# Patient Record
Sex: Female | Born: 1980
Health system: Southern US, Community
[De-identification: ages and names within clinical notes are randomized; demographics above are authoritative.]

## PROBLEM LIST (undated history)

## (undated) DIAGNOSIS — Z8619 Personal history of other infectious and parasitic diseases: Secondary | ICD-10-CM

## (undated) HISTORY — DX: Personal history of other infectious and parasitic diseases: Z86.19

---
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
## 2006-02-19 ENCOUNTER — Emergency Department (HOSPITAL_COMMUNITY): Admission: EM | Admit: 2006-02-19 | Discharge: 2006-02-19 | Payer: Self-pay | Admitting: Emergency Medicine

## 2011-04-03 LAB — OB RESULTS CONSOLE GC/CHLAMYDIA
Chlamydia: NEGATIVE
Gonorrhea: NEGATIVE

## 2011-04-04 LAB — OB RESULTS CONSOLE ANTIBODY SCREEN: Antibody Screen: NEGATIVE

## 2011-04-04 LAB — CBC
HCT: 36 % (ref 36–46)
Hemoglobin: 12.6 g/dL (ref 12.0–16.0)
Hemoglobin: 12.6 g/dL (ref 12.0–16.0)
Platelets: 241 10*3/uL (ref 150–399)

## 2011-04-04 LAB — OB RESULTS CONSOLE RPR: RPR: NONREACTIVE

## 2011-04-04 LAB — OB RESULTS CONSOLE RUBELLA ANTIBODY, IGM: Rubella: IMMUNE

## 2011-04-04 LAB — OB RESULTS CONSOLE ABO/RH: RH Type: POSITIVE

## 2011-04-04 LAB — OB RESULTS CONSOLE HIV ANTIBODY (ROUTINE TESTING): HIV: NONREACTIVE

## 2011-04-04 LAB — OB RESULTS CONSOLE HEPATITIS B SURFACE ANTIGEN: Hepatitis B Surface Ag: NEGATIVE

## 2011-07-12 LAB — CBC: Hemoglobin: 12.3 g/dL (ref 12.0–16.0)

## 2011-07-25 ENCOUNTER — Encounter (INDEPENDENT_AMBULATORY_CARE_PROVIDER_SITE_OTHER): Payer: 59 | Admitting: Obstetrics and Gynecology

## 2011-07-25 DIAGNOSIS — Z331 Pregnant state, incidental: Secondary | ICD-10-CM

## 2011-08-07 ENCOUNTER — Encounter: Payer: 59 | Admitting: Obstetrics and Gynecology

## 2011-08-13 ENCOUNTER — Encounter: Payer: 59 | Admitting: Obstetrics and Gynecology

## 2011-08-29 ENCOUNTER — Ambulatory Visit (INDEPENDENT_AMBULATORY_CARE_PROVIDER_SITE_OTHER): Payer: 59 | Admitting: Obstetrics and Gynecology

## 2011-08-29 ENCOUNTER — Encounter: Payer: Self-pay | Admitting: Obstetrics and Gynecology

## 2011-08-29 VITALS — BP 102/68 | Ht 61.0 in | Wt 150.0 lb

## 2011-08-29 DIAGNOSIS — Z331 Pregnant state, incidental: Secondary | ICD-10-CM

## 2011-08-29 NOTE — Progress Notes (Signed)
Pt without complaints A/P GBS @[facility]  Fetal kick counts reviewed Labor reviewed with pt All patients  questions answered

## 2011-09-13 ENCOUNTER — Encounter: Payer: Self-pay | Admitting: Obstetrics and Gynecology

## 2011-09-13 ENCOUNTER — Ambulatory Visit (INDEPENDENT_AMBULATORY_CARE_PROVIDER_SITE_OTHER): Payer: 59 | Admitting: Obstetrics and Gynecology

## 2011-09-13 VITALS — BP 110/70 | Wt 155.0 lb

## 2011-09-13 DIAGNOSIS — Z331 Pregnant state, incidental: Secondary | ICD-10-CM

## 2011-09-13 NOTE — Patient Instructions (Signed)
Patient Education Materials to be provided at check out (*indicates is located in accordion folder):  Newborn Circumcision  

## 2011-09-13 NOTE — Progress Notes (Signed)
No complaints Circumcision reviewed GBS done today

## 2011-09-17 LAB — OB RESULTS CONSOLE GBS: GBS: NEGATIVE

## 2011-09-20 ENCOUNTER — Ambulatory Visit (INDEPENDENT_AMBULATORY_CARE_PROVIDER_SITE_OTHER): Payer: 59 | Admitting: Obstetrics and Gynecology

## 2011-09-20 VITALS — BP 132/80 | Wt 158.0 lb

## 2011-09-20 DIAGNOSIS — Z3400 Encounter for supervision of normal first pregnancy, unspecified trimester: Secondary | ICD-10-CM | POA: Insufficient documentation

## 2011-09-20 NOTE — Progress Notes (Signed)
Pt. Stated no issues today. Pt stated she wants cervix check today. No c/o.  Will work until delivery.   Undecided re peds.

## 2011-09-26 ENCOUNTER — Ambulatory Visit (INDEPENDENT_AMBULATORY_CARE_PROVIDER_SITE_OTHER): Payer: 59 | Admitting: Obstetrics and Gynecology

## 2011-09-26 VITALS — BP 110/72 | Wt 160.0 lb

## 2011-09-26 DIAGNOSIS — Z331 Pregnant state, incidental: Secondary | ICD-10-CM

## 2011-09-26 NOTE — Progress Notes (Signed)
RTO 1 week.  AVS

## 2011-09-26 NOTE — Progress Notes (Signed)
Pt want cervix check today.   No other problems  lm

## 2011-10-04 ENCOUNTER — Encounter: Payer: Self-pay | Admitting: Obstetrics and Gynecology

## 2011-10-04 ENCOUNTER — Ambulatory Visit (INDEPENDENT_AMBULATORY_CARE_PROVIDER_SITE_OTHER): Payer: 59 | Admitting: Obstetrics and Gynecology

## 2011-10-04 VITALS — BP 112/62 | Wt 161.0 lb

## 2011-10-04 DIAGNOSIS — Z3400 Encounter for supervision of normal first pregnancy, unspecified trimester: Secondary | ICD-10-CM

## 2011-10-04 NOTE — Progress Notes (Signed)
Complaint of pressure Doing well Fetal kick counts and labor precautions Return to office in one week If patient is still pregnant at next visit, will look for induction dates at 41 weeks

## 2011-10-10 ENCOUNTER — Encounter: Payer: Self-pay | Admitting: Obstetrics and Gynecology

## 2011-10-10 ENCOUNTER — Ambulatory Visit (INDEPENDENT_AMBULATORY_CARE_PROVIDER_SITE_OTHER): Payer: 59 | Admitting: Obstetrics and Gynecology

## 2011-10-10 VITALS — BP 116/78 | Ht 61.0 in | Wt 162.0 lb

## 2011-10-10 DIAGNOSIS — O2686 Pruritic urticarial papules and plaques of pregnancy (PUPPP): Secondary | ICD-10-CM

## 2011-10-10 DIAGNOSIS — O480 Post-term pregnancy: Secondary | ICD-10-CM

## 2011-10-10 DIAGNOSIS — L988 Other specified disorders of the skin and subcutaneous tissue: Secondary | ICD-10-CM

## 2011-10-10 DIAGNOSIS — Z3400 Encounter for supervision of normal first pregnancy, unspecified trimester: Secondary | ICD-10-CM

## 2011-10-10 DIAGNOSIS — O26899 Other specified pregnancy related conditions, unspecified trimester: Secondary | ICD-10-CM

## 2011-10-10 DIAGNOSIS — R21 Rash and other nonspecific skin eruption: Secondary | ICD-10-CM

## 2011-10-10 MED ORDER — CLOBETASOL PROP EMOLLIENT BASE 0.05 % EX CREA: TOPICAL_CREAM | CUTANEOUS | Status: DC

## 2011-10-10 NOTE — Progress Notes (Signed)
1 wk hx of pruritic red rash over abdomen=PUPP Temovate cream given and educational info NST [facility] Induction at 41-42 wks if undelivered Membranes stripped

## 2011-10-10 NOTE — Progress Notes (Signed)
Desires cx check today.

## 2011-10-10 NOTE — Patient Instructions (Signed)
Pruritic Urticarial Papules and Plaques of Pregnancy  When you are pregnant, your body changes in many ways. That includes the skin. Rashes sometimes develop. The most common skin rash during pregnancy is called pruritic urticarial papules and plaques of pregnancy (PUPPP). The small red bumps sometimes form large plaques. These are very itchy. The rash usually appears in the last few weeks of pregnancy during the third trimester. Sometimes, it can occur shortly after giving birth. It goes away within 1 to 2 months after delivery. It is not harmful. You may not like the way it looks or feels. However, there is no reason to believe that it does any harm to your baby. It will not leave scars on your skin.  CAUSES   Exactly why this skin rash develops is not known.   Your skin stretches rapidly when you are pregnant. This may cause PUPPP. Stretching happens most over the belly. Skin also stretches in other areas, including the legs, hips, and breasts.   Some women are more likely than others to develop PUPPP. They include:   Those who are having a baby for the first time.   Women who gain too much weight.   Those who are carrying more than 1 baby.  SYMPTOMS    Appearance of a red, raised rash.   It might look like hives or an allergic reaction.   Sometimes, there are tiny blisters in the center of the patches.   The skin around the rash is often pale.   The rash is usually on the belly. It can spread to other parts of the body, such as the thighs or arms.   Severe itching.  DIAGNOSIS   To decide if you have PUPPP, your caregiver may:   Ask about symptoms you have noticed.   Ask about your overall health, today and in the past.   Ask about your pregnancy.   Ask whether you have been near anything that could cause an allergic reaction.   Examine the skin on parts of the body that concern you.   Order a biopsy. A small sample of skin tissue is removed and looked at under a microscope.   Order blood tests.  These may rule out other reasons for a rash.  TREATMENT   The goal is to stop the itching and keep the rash from spreading. Usually, a cream is used to do this. However, treatment varies. Common options include:   Topical corticosteroids. These are powerful drugs that can help stop itching.   Usually, the drug is used as a cream or ointment. It is put directly on the rash.   Sometimes, a stronger oral corticosteroid is needed. This is taken as a pill.   Antihistamines. These drugs are sometimes given for PUPPP. They are oral medicines. They are normally used for allergies, but they may help with the intense itching that can be seen in PUPPP.  Treatment helps nearly all women with PUPPP. The creams or pills should make your skin feel better fairly quickly. The rash and itching should be gone a few months after your baby is born.  HOME CARE INSTRUCTIONS    Do not scratch the rash.   Wear loose clothing.   Apply any creams that your caregiver prescribed. Follow the directions carefully.   Keep all follow-up appointments with your caregiver. This will allow your caregiver to make sure your treatment is working.   Learn more about skin care. You might want to meet with a skin   doctor (dermatologist). Ask about a moisturizer that would help your skin while it stretches during pregnancy. Also, ask about choosing skin products that are right for you.  SEEK MEDICAL CARE IF:    You have any questions about your medicines.   The itching continues or the rash continues to spread.   You continue to be uncomfortable or unable to sleep. Your caregiver may want to change your medicine.  Document Released: 07/31/2009 Document Revised: 04/25/2011 Document Reviewed: 07/31/2009  [employer] Patient Information 2012 [employer].

## 2011-10-13 ENCOUNTER — Encounter (HOSPITAL_COMMUNITY): Payer: Self-pay | Admitting: *Deleted

## 2011-10-13 ENCOUNTER — Inpatient Hospital Stay (HOSPITAL_COMMUNITY)
Admission: AD | Admit: 2011-10-13 | Discharge: 2011-10-17 | DRG: 765 | Disposition: A | Discharge status: Home / Self Care | Payer: 59 | Source: Ambulatory Visit | Attending: Obstetrics and Gynecology | Admitting: Obstetrics and Gynecology

## 2011-10-13 DIAGNOSIS — O9903 Anemia complicating the puerperium: Secondary | ICD-10-CM | POA: Diagnosis not present

## 2011-10-13 DIAGNOSIS — O2686 Pruritic urticarial papules and plaques of pregnancy (PUPPP): Secondary | ICD-10-CM

## 2011-10-13 DIAGNOSIS — Z98891 History of uterine scar from previous surgery: Secondary | ICD-10-CM

## 2011-10-13 DIAGNOSIS — O139 Gestational [pregnancy-induced] hypertension without significant proteinuria, unspecified trimester: Secondary | ICD-10-CM | POA: Diagnosis present

## 2011-10-13 DIAGNOSIS — Z3400 Encounter for supervision of normal first pregnancy, unspecified trimester: Secondary | ICD-10-CM

## 2011-10-13 DIAGNOSIS — D689 Coagulation defect, unspecified: Secondary | ICD-10-CM | POA: Diagnosis present

## 2011-10-13 DIAGNOSIS — O1490 Unspecified pre-eclampsia, unspecified trimester: Secondary | ICD-10-CM | POA: Diagnosis present

## 2011-10-13 DIAGNOSIS — D696 Thrombocytopenia, unspecified: Secondary | ICD-10-CM | POA: Diagnosis present

## 2011-10-13 DIAGNOSIS — D649 Anemia, unspecified: Secondary | ICD-10-CM | POA: Diagnosis not present

## 2011-10-13 DIAGNOSIS — O9912 Other diseases of the blood and blood-forming organs and certain disorders involving the immune mechanism complicating childbirth: Secondary | ICD-10-CM | POA: Diagnosis present

## 2011-10-13 DIAGNOSIS — R21 Rash and other nonspecific skin eruption: Secondary | ICD-10-CM

## 2011-10-13 LAB — URINALYSIS, ROUTINE W REFLEX MICROSCOPIC
Bilirubin Urine: NEGATIVE
Glucose, UA: NEGATIVE mg/dL
Ketones, ur: NEGATIVE mg/dL
Nitrite: NEGATIVE
Protein, ur: NEGATIVE mg/dL
Urobilinogen, UA: 0.2 mg/dL (ref 0.0–1.0)
pH: 6 (ref 5.0–8.0)

## 2011-10-13 LAB — COMPREHENSIVE METABOLIC PANEL
Albumin: 2.6 g/dL — ABNORMAL LOW (ref 3.5–5.2)
Alkaline Phosphatase: 194 U/L — ABNORMAL HIGH (ref 39–117)
BUN: 16 mg/dL (ref 6–23)
Chloride: 102 mEq/L (ref 96–112)
Creatinine, Ser: 1.16 mg/dL — ABNORMAL HIGH (ref 0.50–1.10)
GFR calc Af Amer: 74 mL/min — ABNORMAL LOW (ref >90)
Glucose, Bld: 77 mg/dL (ref 70–99)
Total Bilirubin: 0.4 mg/dL (ref 0.3–1.2)

## 2011-10-13 LAB — CBC
MCV: 95.3 fL (ref 78.0–100.0)
Platelets: 126 10*3/uL — ABNORMAL LOW (ref 150–400)
RBC: 3.8 MIL/uL — ABNORMAL LOW (ref 3.87–5.11)
RDW: 13.8 % (ref 11.5–15.5)
WBC: 6.1 10*3/uL (ref 4.0–10.5)

## 2011-10-13 LAB — LACTATE DEHYDROGENASE: LDH: 374 U/L — ABNORMAL HIGH (ref 94–250)

## 2011-10-13 LAB — URIC ACID: Uric Acid, Serum: 6.9 mg/dL (ref 2.4–7.0)

## 2011-10-13 LAB — URINE MICROSCOPIC-ADD ON

## 2011-10-13 MED ORDER — CITRIC ACID-SODIUM CITRATE 334-500 MG/5ML PO SOLN
30.0000 mL | ORAL | Status: DC | PRN
  Administered 2011-10-14: 30 mL via ORAL
  Filled 2011-10-13: qty 15

## 2011-10-13 MED ORDER — HYDROXYZINE HCL 50 MG PO TABS
50.0000 mg | ORAL_TABLET | Freq: Four times a day (QID) | ORAL | Status: DC | PRN
  Administered 2011-10-13: 50 mg via ORAL
  Filled 2011-10-13: qty 1

## 2011-10-13 MED ORDER — IBUPROFEN 600 MG PO TABS: 600.0000 mg | ORAL_TABLET | Freq: Four times a day (QID) | ORAL | Status: DC | PRN

## 2011-10-13 MED ORDER — ACETAMINOPHEN 325 MG PO TABS
650.0000 mg | ORAL_TABLET | ORAL | Status: DC | PRN
  Administered 2011-10-14: 650 mg via ORAL
  Filled 2011-10-13: qty 2

## 2011-10-13 MED ORDER — MISOPROSTOL 25 MCG QUARTER TABLET: 25.0000 ug | ORAL_TABLET | ORAL | Status: DC | PRN

## 2011-10-13 MED ORDER — TERBUTALINE SULFATE 1 MG/ML IJ SOLN: 0.2500 mg | Freq: Once | INTRAMUSCULAR | Status: AC | PRN

## 2011-10-13 MED ORDER — OXYTOCIN 20 UNITS IN LACTATED RINGERS INFUSION - SIMPLE
1.0000 m[IU]/min | INTRAVENOUS | Status: DC
  Administered 2011-10-13: 1 m[IU]/min via INTRAVENOUS

## 2011-10-13 MED ORDER — HYDROXYZINE HCL 50 MG/ML IM SOLN
50.0000 mg | Freq: Four times a day (QID) | INTRAMUSCULAR | Status: DC | PRN
  Filled 2011-10-13: qty 1

## 2011-10-13 MED ORDER — OXYTOCIN 20 UNITS IN LACTATED RINGERS INFUSION - SIMPLE: 125.0000 mL/h | Freq: Once | INTRAVENOUS | Status: DC

## 2011-10-13 MED ORDER — FLEET ENEMA 7-19 GM/118ML RE ENEM: 1.0000 | ENEMA | RECTAL | Status: DC | PRN

## 2011-10-13 MED ORDER — LIDOCAINE HCL (PF) 1 % IJ SOLN: 30.0000 mL | INTRAMUSCULAR | Status: DC | PRN

## 2011-10-13 MED ORDER — FENTANYL CITRATE 0.05 MG/ML IJ SOLN
50.0000 ug | INTRAMUSCULAR | Status: DC | PRN
  Administered 2011-10-13: 50 ug via INTRAVENOUS
  Filled 2011-10-13: qty 2

## 2011-10-13 MED ORDER — LIDOCAINE HCL 2 % EX GEL
Freq: Once | CUTANEOUS | Status: AC
  Administered 2011-10-13: 10 via URETHRAL
  Filled 2011-10-13: qty 5

## 2011-10-13 MED ORDER — ZOLPIDEM TARTRATE 10 MG PO TABS: 10.0000 mg | ORAL_TABLET | Freq: Every evening | ORAL | Status: DC | PRN

## 2011-10-13 MED ORDER — OXYTOCIN 20 UNITS IN LACTATED RINGERS INFUSION - SIMPLE
1.0000 m[IU]/min | INTRAVENOUS | Status: DC
  Filled 2011-10-13: qty 1000

## 2011-10-13 MED ORDER — OXYTOCIN BOLUS FROM INFUSION
500.0000 mL | Freq: Once | INTRAVENOUS | Status: DC
  Filled 2011-10-13: qty 500

## 2011-10-13 MED ORDER — LACTATED RINGERS IV SOLN
500.0000 mL | INTRAVENOUS | Status: DC | PRN
  Administered 2011-10-14: 500 mL via INTRAVENOUS

## 2011-10-13 MED ORDER — ONDANSETRON HCL 4 MG/2ML IJ SOLN: 4.0000 mg | Freq: Four times a day (QID) | INTRAMUSCULAR | Status: DC | PRN

## 2011-10-13 MED ORDER — OXYCODONE-ACETAMINOPHEN 5-325 MG PO TABS: 1.0000 | ORAL_TABLET | ORAL | Status: DC | PRN

## 2011-10-13 MED ORDER — LACTATED RINGERS IV SOLN
INTRAVENOUS | Status: DC
  Administered 2011-10-13 – 2011-10-14 (×4): via INTRAVENOUS

## 2011-10-13 NOTE — H&P (Signed)
Kathy Jenkins is a 31 y.o.black female presenting at 40.4 weeks for r/o ROM.  She presents unannounced and after arriving, noted pt's BP elevated and she c/o Rt-sided headache today.  Denied any gushes of fluid; reports some trickling of bloody d/c.  GFM.  No other PIH s/s.  Denies any recent illness or fever.  No resp or GI c/o's.  Hasn't eaten since breakfast.    Prenatal Course:  Pt has had an uncomplicated pregnancy.  She started care around 12 weeks, and reported an unsure LMP, and dates were confirmed by u/s at time of 1st trimester screen.  Both 1st trimester and AFP screens were negative, and had normal anatomy u/s w/ exception of isolated RVEIF.  At f/u u/s, foci no longer seen.  She had normal pregnancy discomforts.  At 39 weeks she started noting rash on abdomen and upper thighs, and Dr. Pennie Rushing dx'd w/ PUPP and rx'd topical steroid cream w/ some benefit since starting.  She has been normotensive at all exams.  She has gained a little over 40lbs total during pregnancy.  She hasn't had any 3rd trimester u/s.    Maternal Medical History:  Contractions: Frequency: rare.    Fetal activity: Perceived fetal activity is normal.    Prenatal complications: 1.  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  Unsure LMP 4.  RVEIF on anatomy, and not present on f/u u/s    OB History    Grav Para Term Preterm Abortions TAB SAB Ect Mult Living   1 0 0 0 0   0 0 0     Past Medical History  Diagnosis Date  . H/O varicella   . H/O candidiasis    History reviewed. No pertinent past surgical history. Family History: family history includes Asthma in her brother; Cancer in her mother; Diabetes in her maternal grandmother; Heart disease in her maternal aunt and maternal uncle; and Hypertension in her father, mother, and paternal grandmother. Social History:  reports that she has never smoked. She has never used smokeless tobacco. She reports that she does not drink alcohol or use illicit drugs.pt is Charity fundraiser.  Husband  "dennis" involved and supportive; he is originally from [place].  They are of Christian faith.    Review of Systems  Constitutional: Negative.   HENT:       HA today; hasn't tried any relief measures; points to Rt side of head above hairline; describes as feeling like headache when she is getting ready to start her period  Eyes: Negative.   Respiratory: Negative.   Cardiovascular: Negative.   Gastrointestinal: Negative.   Genitourinary: Negative.   Musculoskeletal: Negative.   Skin: Positive for rash.       Using hydrocortisone cream for rash w/ some relief, but not resolved  Neurological: Positive for headaches.   INITIAL BP'S IN MAU=159/95, 143/100, & 147/102 Dilation: 1.5 Effacement (%): 70 Station: -2 Exam by:: Hiliary Zygmunt Mcglinn CNM  Blood pressure 127/83, pulse 74, temperature 98.4 F (36.9 C), temperature source Oral, resp. rate 18, height 5\' 1"  (1.549 m), weight 163 lb (73.936 kg), last menstrual period 01/02/2011. Maternal Exam:  Uterine Assessment: Contraction strength is mild.  Contraction frequency is irregular.   Abdomen: Patient reports no abdominal tenderness. Fetal presentation: vertex  Introitus: Normal vulva. Ferning test: not done.  Nitrazine test: not done.  Pelvis: adequate for delivery.   Cervix: Cervix evaluated by digital exam.     Fetal Exam Fetal Monitor Review: Mode: ultrasound.   Baseline  rate: 140.  Variability: moderate (6-25 bpm).   Pattern: accelerations present and variable decelerations.    Fetal State Assessment: Category I - tracings are normal.    .. Results for orders placed during the hospital encounter of 10/13/11 (from the past 24 hour(s))  URINALYSIS, ROUTINE W REFLEX MICROSCOPIC     Status: Abnormal   Collection Time   10/13/11  3:30 PM      Component Value Range   Color, Urine YELLOW  YELLOW    APPearance CLEAR  CLEAR    Specific Gravity, Urine <1.005 (*) 1.005 - 1.030    pH 6.0  5.0 - 8.0    Glucose, UA NEGATIVE   NEGATIVE (mg/dL)   Hgb urine dipstick SMALL (*) NEGATIVE    Bilirubin Urine NEGATIVE  NEGATIVE    Ketones, ur NEGATIVE  NEGATIVE (mg/dL)   Protein, ur NEGATIVE  NEGATIVE (mg/dL)   Urobilinogen, UA 0.2  0.0 - 1.0 (mg/dL)   Nitrite NEGATIVE  NEGATIVE    Leukocytes, UA TRACE (*) NEGATIVE   URINE MICROSCOPIC-ADD ON     Status: Abnormal   Collection Time   10/13/11  3:30 PM      Component Value Range   Squamous Epithelial / LPF FEW (*) RARE    WBC, UA 0-2  <3 (WBC/hpf)   Bacteria, UA RARE  RARE   CBC     Status: Abnormal   Collection Time   10/13/11  4:01 PM      Component Value Range   WBC 6.1  4.0 - 10.5 (K/uL)   RBC 3.80 (*) 3.87 - 5.11 (MIL/uL)   Hemoglobin 12.4  12.0 - 15.0 (g/dL)   HCT 24.4  01.0 - 27.2 (%)   MCV 95.3  78.0 - 100.0 (fL)   MCH 32.6  26.0 - 34.0 (pg)   MCHC 34.3  30.0 - 36.0 (g/dL)   RDW 53.6  64.4 - 03.4 (%)   Platelets 126 (*) 150 - 400 (K/uL)  COMPREHENSIVE METABOLIC PANEL     Status: Abnormal   Collection Time   10/13/11  4:01 PM      Component Value Range   Sodium 132 (*) 135 - 145 (mEq/L)   Potassium 4.1  3.5 - 5.1 (mEq/L)   Chloride 102  96 - 112 (mEq/L)   CO2 19  19 - 32 (mEq/L)   Glucose, Bld 77  70 - 99 (mg/dL)   BUN 16  6 - 23 (mg/dL)   Creatinine, Ser 7.42 (*) 0.50 - 1.10 (mg/dL)   Calcium 8.9  8.4 - 59.5 (mg/dL)   Total Protein 6.0  6.0 - 8.3 (g/dL)   Albumin 2.6 (*) 3.5 - 5.2 (g/dL)   AST 29  0 - 37 (U/L)   ALT 15  0 - 35 (U/L)   Alkaline Phosphatase 194 (*) 39 - 117 (U/L)   Total Bilirubin 0.4  0.3 - 1.2 (mg/dL)   GFR calc non Af Amer 63 (*) >90 (mL/min)   GFR calc Af Amer 74 (*) >90 (mL/min)  URIC ACID     Status: Normal   Collection Time   10/13/11  4:01 PM      Component Value Range   Uric Acid, Serum 6.9  2.4 - 7.0 (mg/dL)  LACTATE DEHYDROGENASE     Status: Abnormal   Collection Time   10/13/11  4:01 PM      Component Value Range   LDH 374 (*) 94 - 250 (U/L)  RPR     Status:  Normal   Collection Time   10/13/11  6:40 PM       Component Value Range   RPR NON REACTIVE  NON REACTIVE   CBC     Status: Abnormal   Collection Time   10/14/11 12:29 AM      Component Value Range   WBC 7.7  4.0 - 10.5 (K/uL)   RBC 3.80 (*) 3.87 - 5.11 (MIL/uL)   Hemoglobin 12.3  12.0 - 15.0 (g/dL)   HCT 16.1  09.6 - 04.5 (%)   MCV 95.5  78.0 - 100.0 (fL)   MCH 32.4  26.0 - 34.0 (pg)   MCHC 33.9  30.0 - 36.0 (g/dL)   RDW 40.9  81.1 - 91.4 (%)   Platelets 120 (*) 150 - 400 (K/uL)   Physical Exam  Constitutional: She is oriented to person, place, and time. She appears well-developed and well-nourished. No distress.  HENT:  Head: Normocephalic and atraumatic.  Eyes: Pupils are equal, round, and reactive to light.  Cardiovascular: Normal rate.   Respiratory: Effort normal.  GI: Soft.       gravid  Genitourinary:       Cx:  1.5/75/-2; posterior; sm amt of bloody show  Musculoskeletal: She exhibits edema.       RLE> LLE; 1+ but pedal and ankle edema on Rt; DTR's 1+; no clonus  Neurological: She is alert and oriented to person, place, and time.  Skin: Skin is warm and dry. Rash noted.       PUPPS rash on abdomen & top of thighs(less than on abdomen)  Psychiatric: She has a normal mood and affect. Her behavior is normal. Judgment and thought content normal.    Prenatal labs: ABO, Rh: O/Positive/-- (11/15 0000) Antibody: Negative (11/15 0000) Rubella: Immune (11/15 0000) RPR: Nonreactive, Nonreactive (11/15 0000)  HBsAg: Negative (11/15 0000)  HIV: Non-reactive (11/15 0000)  GBS: Negative (04/30 0000)  [place] screen negative Platelets =241 (NOB) 1hr gtt=116; Hgb at gtt=12.3  Assessment/Plan: 1.  40.4 weeks 2.  PIH 3.  Thrombocytopenia and elevated LDH 4.  PUPPS  5.  Questionable late w/ 2 ctxs in MAU; overall tracing reassuring  1. Admit to [facility] w/ Dr. Su Hilt as attending 2.  Rout L&D orders 3.  Start 24 hr urine using Foley b/c of bloody show 4.  Repeat PIH labs daily or prn 5.  Per MD will reassess need for Mag sulfate  after 24 hr urine resulted 6.  Will try cytotec, after pt allowed to eat dinner, but if ctxs too close, will do foley bulb and Pitocin 7.  C/w MD prn  Donnica Jarnagin H 10/13/2011, 6:08 PM

## 2011-10-13 NOTE — MAU Note (Signed)
Pt reports she noticed some clear and brownish discharge leaking out this morning.  Still having some leak out but no big gush. Having a few contractions "here and there" and reports good fetal movement

## 2011-10-14 ENCOUNTER — Inpatient Hospital Stay (HOSPITAL_COMMUNITY): Payer: 59 | Admitting: Anesthesiology

## 2011-10-14 ENCOUNTER — Encounter (HOSPITAL_COMMUNITY)
Admission: AD | Disposition: A | Discharge status: Home / Self Care | Payer: Self-pay | Source: Ambulatory Visit | Attending: Obstetrics and Gynecology

## 2011-10-14 ENCOUNTER — Encounter (HOSPITAL_COMMUNITY): Payer: Self-pay | Admitting: Anesthesiology

## 2011-10-14 ENCOUNTER — Encounter (HOSPITAL_COMMUNITY): Payer: Self-pay

## 2011-10-14 LAB — CBC
HCT: 38.2 % (ref 36.0–46.0)
Hemoglobin: 10.8 g/dL — ABNORMAL LOW (ref 12.0–15.0)
Hemoglobin: 12.3 g/dL (ref 12.0–15.0)
MCH: 32.3 pg (ref 26.0–34.0)
MCH: 32.4 pg (ref 26.0–34.0)
MCHC: 33.9 g/dL (ref 30.0–36.0)
MCHC: 34.6 g/dL (ref 30.0–36.0)
MCV: 95.2 fL (ref 78.0–100.0)
MCV: 95.5 fL (ref 78.0–100.0)
MCV: 96.2 fL (ref 78.0–100.0)
Platelets: 120 10*3/uL — ABNORMAL LOW (ref 150–400)
RBC: 3.34 MIL/uL — ABNORMAL LOW (ref 3.87–5.11)
RBC: 3.8 MIL/uL — ABNORMAL LOW (ref 3.87–5.11)
RDW: 13.8 % (ref 11.5–15.5)

## 2011-10-14 LAB — LACTATE DEHYDROGENASE: LDH: 267 U/L — ABNORMAL HIGH (ref 94–250)

## 2011-10-14 LAB — URIC ACID: Uric Acid, Serum: 8 mg/dL — ABNORMAL HIGH (ref 2.4–7.0)

## 2011-10-14 LAB — COMPREHENSIVE METABOLIC PANEL
Albumin: 2.7 g/dL — ABNORMAL LOW (ref 3.5–5.2)
BUN: 22 mg/dL (ref 6–23)
Creatinine, Ser: 2.08 mg/dL — ABNORMAL HIGH (ref 0.50–1.10)
Total Protein: 6.5 g/dL (ref 6.0–8.3)

## 2011-10-14 SURGERY — Surgical Case
Anesthesia: Regional | Site: Abdomen | Wound class: Clean Contaminated

## 2011-10-14 MED ORDER — PHENYLEPHRINE 40 MCG/ML (10ML) SYRINGE FOR IV PUSH (FOR BLOOD PRESSURE SUPPORT): 80.0000 ug | PREFILLED_SYRINGE | INTRAVENOUS | Status: DC | PRN

## 2011-10-14 MED ORDER — ONDANSETRON HCL 4 MG/2ML IJ SOLN
INTRAMUSCULAR | Status: DC | PRN
  Administered 2011-10-14: 4 mg via INTRAVENOUS

## 2011-10-14 MED ORDER — SCOPOLAMINE 1 MG/3DAYS TD PT72
MEDICATED_PATCH | TRANSDERMAL | Status: AC
  Filled 2011-10-14: qty 1

## 2011-10-14 MED ORDER — LACTATED RINGERS IV BOLUS (SEPSIS)
500.0000 mL | Freq: Once | INTRAVENOUS | Status: AC
  Administered 2011-10-14: 500 mL via INTRAVENOUS

## 2011-10-14 MED ORDER — MAGNESIUM SULFATE 40 G IN LACTATED RINGERS - SIMPLE
2.0000 g/h | INTRAVENOUS | Status: DC
  Administered 2011-10-14: 2 g/h via INTRAVENOUS
  Filled 2011-10-14: qty 500

## 2011-10-14 MED ORDER — LIDOCAINE HCL (PF) 1 % IJ SOLN
INTRAMUSCULAR | Status: DC | PRN
  Administered 2011-10-14 (×3): 4 mL

## 2011-10-14 MED ORDER — EPHEDRINE 5 MG/ML INJ
10.0000 mg | INTRAVENOUS | Status: DC | PRN
  Filled 2011-10-14: qty 4

## 2011-10-14 MED ORDER — MORPHINE SULFATE (PF) 0.5 MG/ML IJ SOLN
INTRAMUSCULAR | Status: DC | PRN
  Administered 2011-10-14: .5 mg via EPIDURAL
  Administered 2011-10-14: .5 mg via INTRAVENOUS

## 2011-10-14 MED ORDER — CEFAZOLIN SODIUM 1-5 GM-% IV SOLN
1.0000 g | Freq: Once | INTRAVENOUS | Status: AC
  Administered 2011-10-14: 2 g via INTRAVENOUS
  Filled 2011-10-14: qty 50

## 2011-10-14 MED ORDER — 0.9 % SODIUM CHLORIDE (POUR BTL) OPTIME
TOPICAL | Status: DC | PRN
  Administered 2011-10-14: 1000 mL

## 2011-10-14 MED ORDER — PHENYLEPHRINE 40 MCG/ML (10ML) SYRINGE FOR IV PUSH (FOR BLOOD PRESSURE SUPPORT)
80.0000 ug | PREFILLED_SYRINGE | INTRAVENOUS | Status: DC | PRN
  Filled 2011-10-14: qty 5

## 2011-10-14 MED ORDER — LACTATED RINGERS IV SOLN
INTRAVENOUS | Status: DC | PRN
  Administered 2011-10-14 (×2): via INTRAVENOUS

## 2011-10-14 MED ORDER — SCOPOLAMINE 1 MG/3DAYS TD PT72
1.0000 | MEDICATED_PATCH | Freq: Once | TRANSDERMAL | Status: DC
  Administered 2011-10-14: 1.5 mg via TRANSDERMAL

## 2011-10-14 MED ORDER — OXYTOCIN 10 UNIT/ML IJ SOLN
20.0000 [IU] | INTRAVENOUS | Status: DC | PRN
  Administered 2011-10-14: 20 [IU] via INTRAVENOUS

## 2011-10-14 MED ORDER — MORPHINE SULFATE 0.5 MG/ML IJ SOLN
INTRAMUSCULAR | Status: AC
  Filled 2011-10-14: qty 10

## 2011-10-14 MED ORDER — MEPERIDINE HCL 25 MG/ML IJ SOLN
INTRAMUSCULAR | Status: DC | PRN
  Administered 2011-10-14: 12.5 mg via INTRAVENOUS

## 2011-10-14 MED ORDER — FENTANYL 2.5 MCG/ML BUPIVACAINE 1/10 % EPIDURAL INFUSION (WH - ANES)
14.0000 mL/h | INTRAMUSCULAR | Status: DC
  Administered 2011-10-14 (×5): 14 mL/h via EPIDURAL
  Filled 2011-10-14 (×5): qty 60

## 2011-10-14 MED ORDER — MORPHINE SULFATE (PF) 0.5 MG/ML IJ SOLN
INTRAMUSCULAR | Status: DC | PRN
Start: 2011-10-14 — End: 2011-10-14
  Administered 2011-10-14: 4 mg via EPIDURAL

## 2011-10-14 MED ORDER — CEFAZOLIN SODIUM 1-5 GM-% IV SOLN
INTRAVENOUS | Status: AC
  Filled 2011-10-14: qty 50

## 2011-10-14 MED ORDER — MEPERIDINE HCL 25 MG/ML IJ SOLN
INTRAMUSCULAR | Status: AC
  Filled 2011-10-14: qty 1

## 2011-10-14 MED ORDER — EPHEDRINE 5 MG/ML INJ: 10.0000 mg | INTRAVENOUS | Status: DC | PRN

## 2011-10-14 MED ORDER — DIPHENHYDRAMINE HCL 50 MG/ML IJ SOLN: 12.5000 mg | INTRAMUSCULAR | Status: DC | PRN

## 2011-10-14 MED ORDER — LACTATED RINGERS IV SOLN
500.0000 mL | Freq: Once | INTRAVENOUS | Status: AC
  Administered 2011-10-14: 500 mL via INTRAVENOUS

## 2011-10-14 MED ORDER — MAGNESIUM SULFATE BOLUS VIA INFUSION
4.0000 g | Freq: Once | INTRAVENOUS | Status: AC
  Administered 2011-10-14: 4 g via INTRAVENOUS
  Filled 2011-10-14: qty 500

## 2011-10-14 MED ORDER — SODIUM BICARBONATE 8.4 % IV SOLN
INTRAVENOUS | Status: DC | PRN
  Administered 2011-10-14: 5 mL via EPIDURAL

## 2011-10-14 SURGICAL SUPPLY — 39 items
BENZOIN TINCTURE PRP APPL 2/3 (GAUZE/BANDAGES/DRESSINGS) ×2 IMPLANT
CHLORAPREP W/TINT 26ML (MISCELLANEOUS) ×4 IMPLANT
CLOTH BEACON ORANGE TIMEOUT ST (SAFETY) ×2 IMPLANT
CONTAINER PREFILL 10% NBF 15ML (MISCELLANEOUS) IMPLANT
ELECT REM PT RETURN 9FT ADLT (ELECTROSURGICAL) ×2
ELECTRODE REM PT RTRN 9FT ADLT (ELECTROSURGICAL) ×1 IMPLANT
EXTRACTOR VACUUM M CUP 4 TUBE (SUCTIONS) IMPLANT
GLOVE BIO SURGEON STRL SZ7 (GLOVE) ×2 IMPLANT
GLOVE BIO SURGEON STRL SZ7.5 (GLOVE) ×4 IMPLANT
GLOVE BIOGEL PI IND STRL 7.5 (GLOVE) ×1 IMPLANT
GLOVE BIOGEL PI IND STRL 8 (GLOVE) ×1 IMPLANT
GLOVE BIOGEL PI INDICATOR 7.5 (GLOVE) ×1
GLOVE BIOGEL PI INDICATOR 8 (GLOVE) ×1
GLOVE SKINSENSE NS SZ7.5 (GLOVE) ×1
GLOVE SKINSENSE NS SZ8.0 LF (GLOVE) ×1
GLOVE SKINSENSE STRL SZ7.5 (GLOVE) ×1 IMPLANT
GLOVE SKINSENSE STRL SZ8.0 LF (GLOVE) ×1 IMPLANT
GOWN PREVENTION PLUS LG XLONG (DISPOSABLE) ×6 IMPLANT
KIT ABG SYR 3ML LUER SLIP (SYRINGE) IMPLANT
NEEDLE HYPO 22GX1.5 SAFETY (NEEDLE) IMPLANT
NEEDLE HYPO 25X5/8 SAFETYGLIDE (NEEDLE) IMPLANT
NS IRRIG 1000ML POUR BTL (IV SOLUTION) ×2 IMPLANT
PACK C SECTION WH (CUSTOM PROCEDURE TRAY) ×2 IMPLANT
RETRACTOR WND ALEXIS 25 LRG (MISCELLANEOUS) ×1 IMPLANT
RTRCTR WOUND ALEXIS 25CM LRG (MISCELLANEOUS) ×2
SLEEVE SCD COMPRESS KNEE MED (MISCELLANEOUS) ×2 IMPLANT
SPONGE LAP 18X18 X RAY DECT (DISPOSABLE) ×2 IMPLANT
STRIP CLOSURE SKIN 1/2X4 (GAUZE/BANDAGES/DRESSINGS) ×2 IMPLANT
SUT CHROMIC 2 0 CT 1 (SUTURE) ×2 IMPLANT
SUT MNCRL AB 3-0 PS2 27 (SUTURE) ×2 IMPLANT
SUT PLAIN 0 NONE (SUTURE) IMPLANT
SUT PLAIN 2 0 XLH (SUTURE) ×2 IMPLANT
SUT VIC AB 0 CT1 36 (SUTURE) ×2 IMPLANT
SUT VIC AB 0 CTX 36 (SUTURE) ×5
SUT VIC AB 0 CTX36XBRD ANBCTRL (SUTURE) ×5 IMPLANT
SYR CONTROL 10ML LL (SYRINGE) IMPLANT
TOWEL OR 17X24 6PK STRL BLUE (TOWEL DISPOSABLE) ×4 IMPLANT
TRAY FOLEY CATH 14FR (SET/KITS/TRAYS/PACK) IMPLANT
WATER STERILE IRR 1000ML POUR (IV SOLUTION) IMPLANT

## 2011-10-14 NOTE — Progress Notes (Signed)
Kathy Jenkins is a 31 y.o. G1P0000 at [redacted]w[redacted]d admitted for rupture of membranes as well as elevated blood pressure  Subjective: Pt remains comfortable with epidural.  Denies toxic s/s  Objective: BP 149/92  Pulse 75  Temp(Src) 98 F (36.7 C) (Oral)  Resp 18  Ht 5\' 1"  (1.549 m)  Wt 73.936 kg (163 lb)  BMI 30.80 kg/m2  SpO2 100%  LMP 01/02/2011 I/O last 3 completed shifts: In: 3359.7 [P.O.:1020; I.V.:1839.7; IV Piggyback:500] Out: 1060 [Urine:1060]  Urine output hourly past 4 hrs: 30cc/50cc/30cc/0cc Filed Vitals:   10/14/11 1810 10/14/11 1832 10/14/11 1901 10/14/11 1913  BP: 144/95 131/79 143/100 149/92  Pulse: 72 82 86 75  Temp:      TempSrc:      Resp:  20 20 18   Height:      Weight:      SpO2:        FHT:  FHR: 150 bpm, variability: minimal ,  accelerations:  Abscent,  decelerations:  Present Late decelerations present UC:   regular, every 1.5-3 minutes. Pitocin at 51mu/min.  MVUs over the past hour 170-180.  Adequate MVUs over the past 4hrs without change. SVE:   Dilation: 7 Effacement (%): 70 Station: -2 Exam by:: Conni Elliot, CNM Cx edematous. Sm amt bloody show. Labs: Lab Results  Component Value Date   WBC 7.7 10/14/2011   HGB 12.3 10/14/2011   HCT 36.3 10/14/2011   MCV 95.5 10/14/2011   PLT 120* 10/14/2011    Assessment / Plan: Arrest in active phase of labor Oliguria Elevated blood pressure Fetal heart rate decels  Labor: Arrest in the active phase of labor. Preeclampsia:  no signs or symptoms of toxicity Fetal Wellbeing:  Category II Pain Control:  Epidural I/D:  n/a Anticipated MOD:  C/S  Consult obtained with Dr. Su Hilt. RBA C/S due to lack of progress in cervical dilation as well as maternal condition d/w pt and family including bleeding, infection, risk of damage to adjacent organs such as bowel and bladder.  Pt agrees to proceed. IVF bolus. D/C pitocin.  Repeat CBC and CMP pending.   Rei Contee O. 10/14/2011, 8:09 PM

## 2011-10-14 NOTE — Progress Notes (Signed)
Patient ID: Kathy Jenkins, female   DOB: [birth year], 31 y.o.   MRN: [id number] Pt currently sleeping, so will defer cervical exam.  Pitocin on 65mu/min and ctxs every 2-4 min. FHT WNL and w/o decels since Pitocin restarted at 0330.  BP's lower since epidural. .. Filed Vitals:   10/14/11 0431 10/14/11 0508 10/14/11 0511 10/14/11 0532  BP: 125/68 133/70  128/71  Pulse: 77 93  78  Temp:  98.3 F (36.8 C)    TempSrc:  Oral    Resp: 18 18 18 18   Height:      Weight:      SpO2:      A/P: 1.  IOL in progress for PIH w/ thrombocytopenia and elevated LDH 2.  GBS neg 3.  One series of lates just after epidural; none since  1.  Continue current POC 2.  C/w MD prn  C. [place], [place] 10/14/11 0600

## 2011-10-14 NOTE — Transfer of Care (Signed)
Immediate Anesthesia Transfer of Care Note  Patient: Kathy Jenkins  Procedure(s) Performed: Procedure(s) (LRB): CESAREAN SECTION (N/A)  Patient Location: PACU  Anesthesia Type: Epidural  Level of Consciousness: awake, alert  and oriented  Airway & Oxygen Therapy: Patient Spontanous Breathing  Post-op Assessment: Report given to PACU RN and Post -op Vital signs reviewed and stable  Post vital signs: Reviewed and stable  Complications: No apparent anesthesia complications

## 2011-10-14 NOTE — Anesthesia Preprocedure Evaluation (Signed)
Anesthesia Evaluation  Patient identified by MRN, date of birth, ID band Patient awake    Reviewed: Allergy & Precautions, H&P , NPO status , Patient's Chart, lab work & pertinent test results, reviewed documented beta blocker date and time   History of Anesthesia Complications Negative for: history of anesthetic complications  Airway Mallampati: II TM Distance: >3 FB Neck ROM: full    Dental  (+) Teeth Intact   Pulmonary neg pulmonary ROS,  breath sounds clear to auscultation        Cardiovascular hypertension (PIH), Rhythm:regular Rate:Normal     Neuro/Psych negative neurological ROS  negative psych ROS   GI/Hepatic negative GI ROS, Neg liver ROS,   Endo/Other  negative endocrine ROS  Renal/GU negative Renal ROS     Musculoskeletal   Abdominal   Peds  Hematology negative hematology ROS (+)   Anesthesia Other Findings   Reproductive/Obstetrics (+) Pregnancy                           Anesthesia Physical Anesthesia Plan  ASA: III  Anesthesia Plan: Epidural   Post-op Pain Management:    Induction:   Airway Management Planned:   Additional Equipment:   Intra-op Plan:   Post-operative Plan:   Informed Consent: I have reviewed the patients History and Physical, chart, labs and discussed the procedure including the risks, benefits and alternatives for the proposed anesthesia with the patient or authorized representative who has indicated his/her understanding and acceptance.     Plan Discussed with:   Anesthesia Plan Comments:         Anesthesia Quick Evaluation

## 2011-10-14 NOTE — Progress Notes (Signed)
Subjective: Pt on left side and tearful.  Received IV Fentanyl around 2334 w/ no relief.  Husband resting on couch at bedside.  Pt's Pitocin started at 2145; orders to increase by 1mu every hr.  Offered to place Foley bulb in addition to Pitocin, and pt refused b/c already has foley in bladder.  No LOF.  No PIH s/s. BP's have increased since induction started, and more pain.    Objective: BP 142/84  Pulse 71  Temp(Src) 98 F (36.7 C) (Oral)  Resp 18  Ht 5\' 1"  (1.549 m)  Wt 163 lb (73.936 kg)  BMI 30.80 kg/m2  LMP 01/02/2011  .Marland Kitchen Filed Vitals:   10/13/11 1818 10/13/11 2058 10/13/11 2155 10/13/11 2302  BP:  155/95 140/94 142/84  Pulse:  74 72 71  Temp:  98 F (36.7 C)    TempSrc:  Oral    Resp:  18 18 18   Height: 5\' 1"  (1.549 m)     Weight: 163 lb (73.936 kg)         FHT:  FHR: 135 bpm, variability: moderate,  accelerations:  Present,  decelerations:  Absent UC:   regular, every 1-3 minutes SVE:   Dilation: 1.5 Effacement (%): 70 Station: -2 Exam by:: l.poore, rn Pelvic deferred at present  Assessment / Plan: 1.  40.5  2.  induction for PIH and abnl labs (24 hr urine in progress) 3. GBS neg  Labor: induction in progress Preeclampsia:  per dr. Su Hilt, will await 24 hr urine result before deciding if Mag sulfate necessitated; or reassess if BP continues to rise Fetal Wellbeing:  Category I Pain Control:  Fentanyl I/D:  n/a Anticipated MOD:  NSVD 1.  Reiterated w/ pt that she can have epidural prn; will need recheck platelets before placing b/c >6 hrs. 2.  Support given.   3.  Continue to titrate Pitocin 4.  C/w MD prn. Lamont Glasscock H 10/14/2011, 12:19 AM

## 2011-10-14 NOTE — Progress Notes (Addendum)
Kathy Jenkins is a 31 y.o. G1P0000 at [redacted]w[redacted]d admitted for induction of labor due to elevated blood pressures.  Subjective: Pt comfortable with epidural.  Reports contd headache since yesterday.  Denies vision chgs, RUQ pain.  Thinks she may be leaking more fluid.  24hr urine in progress.  Objective: BP 124/69  Pulse 81  Temp(Src) 98.3 F (36.8 C) (Oral)  Resp 20  Ht 5\' 1"  (1.549 m)  Wt 73.936 kg (163 lb)  BMI 30.80 kg/m2  SpO2 100%  LMP 01/02/2011 I/O last 3 completed shifts: In: -  Out: 750 [Urine:750] Total I/O In: 1051.6 [I.V.:1051.6] Out: -  Filed Vitals:   10/14/11 0631 10/14/11 0701 10/14/11 0731 10/14/11 0801  BP: 126/61 124/68 124/69 126/72  Pulse: 69 72 81 68  Temp:      TempSrc:      Resp: 18 18 20 20   Height:      Weight:      SpO2:       FHT:  FHR: 135 bpm, variability: moderate,  accelerations:  Present,  decelerations:  Absent. No further late decels noted at present UC:   regular, every 2-4 minutes SVE:   Dilation: 5.5 Effacement (%): 80 Station: -2 Exam by:: Elsie Ra, CNM Sm amt of dark red vaginal bleeding with small clots noted.  Also thin bloody discharge noted.  BBOW noted.   Pitocin at 10mu/min.  Extrems with 2+ edema. DTRs 1+, no clonus at present.   Labs:  Results for orders placed during the hospital encounter of 10/13/11 (from the past 24 hour(s))  URINALYSIS, ROUTINE W REFLEX MICROSCOPIC     Status: Abnormal   Collection Time   10/13/11  3:30 PM      Component Value Range   Color, Urine YELLOW  YELLOW    APPearance CLEAR  CLEAR    Specific Gravity, Urine <1.005 (*) 1.005 - 1.030    pH 6.0  5.0 - 8.0    Glucose, UA NEGATIVE  NEGATIVE (mg/dL)   Hgb urine dipstick SMALL (*) NEGATIVE    Bilirubin Urine NEGATIVE  NEGATIVE    Ketones, ur NEGATIVE  NEGATIVE (mg/dL)   Protein, ur NEGATIVE  NEGATIVE (mg/dL)   Urobilinogen, UA 0.2  0.0 - 1.0 (mg/dL)   Nitrite NEGATIVE  NEGATIVE    Leukocytes, UA TRACE (*) NEGATIVE   URINE  MICROSCOPIC-ADD ON     Status: Abnormal   Collection Time   10/13/11  3:30 PM      Component Value Range   Squamous Epithelial / LPF FEW (*) RARE    WBC, UA 0-2  <3 (WBC/hpf)   Bacteria, UA RARE  RARE   CBC     Status: Abnormal   Collection Time   10/13/11  4:01 PM      Component Value Range   WBC 6.1  4.0 - 10.5 (K/uL)   RBC 3.80 (*) 3.87 - 5.11 (MIL/uL)   Hemoglobin 12.4  12.0 - 15.0 (g/dL)   HCT 30.8  65.7 - 84.6 (%)   MCV 95.3  78.0 - 100.0 (fL)   MCH 32.6  26.0 - 34.0 (pg)   MCHC 34.3  30.0 - 36.0 (g/dL)   RDW 96.2  95.2 - 84.1 (%)   Platelets 126 (*) 150 - 400 (K/uL)  COMPREHENSIVE METABOLIC PANEL     Status: Abnormal   Collection Time   10/13/11  4:01 PM      Component Value Range   Sodium 132 (*) 135 - 145 (mEq/L)  Potassium 4.1  3.5 - 5.1 (mEq/L)   Chloride 102  96 - 112 (mEq/L)   CO2 19  19 - 32 (mEq/L)   Glucose, Bld 77  70 - 99 (mg/dL)   BUN 16  6 - 23 (mg/dL)   Creatinine, Ser 9.60 (*) 0.50 - 1.10 (mg/dL)   Calcium 8.9  8.4 - 45.4 (mg/dL)   Total Protein 6.0  6.0 - 8.3 (g/dL)   Albumin 2.6 (*) 3.5 - 5.2 (g/dL)   AST 29  0 - 37 (U/L)   ALT 15  0 - 35 (U/L)   Alkaline Phosphatase 194 (*) 39 - 117 (U/L)   Total Bilirubin 0.4  0.3 - 1.2 (mg/dL)   GFR calc non Af Amer 63 (*) >90 (mL/min)   GFR calc Af Amer 74 (*) >90 (mL/min)  URIC ACID     Status: Normal   Collection Time   10/13/11  4:01 PM      Component Value Range   Uric Acid, Serum 6.9  2.4 - 7.0 (mg/dL)  LACTATE DEHYDROGENASE     Status: Abnormal   Collection Time   10/13/11  4:01 PM      Component Value Range   LDH 374 (*) 94 - 250 (U/L)  RPR     Status: Normal   Collection Time   10/13/11  6:40 PM      Component Value Range   RPR NON REACTIVE  NON REACTIVE   CBC     Status: Abnormal   Collection Time   10/14/11 12:29 AM      Component Value Range   WBC 7.7  4.0 - 10.5 (K/uL)   RBC 3.80 (*) 3.87 - 5.11 (MIL/uL)   Hemoglobin 12.3  12.0 - 15.0 (g/dL)   HCT 09.8  11.9 - 14.7 (%)   MCV 95.5  78.0 -  100.0 (fL)   MCH 32.4  26.0 - 34.0 (pg)   MCHC 33.9  30.0 - 36.0 (g/dL)   RDW 82.9  56.2 - 13.0 (%)   Platelets 120 (*) 150 - 400 (K/uL)    Assessment / Plan: Induction of labor due to gestational hypertension,  progressing well on pitocin  Labor: Progressing normally Preeclampsia:  Headache, otherwise no toxic signs/symptoms Fetal Wellbeing:  Category I Pain Control:  Epidural I/D:  n/a Anticipated MOD:  NSVD  Will observe vag bleeding very carefully.  Dr. Su Hilt updated by phone.    Burris Matherne O. 10/14/2011, 8:22 AM

## 2011-10-14 NOTE — Progress Notes (Signed)
Kathy Jenkins is a 31 y.o. G1P0000 at [redacted]w[redacted]d admitted for rupture of membranes.  Subjective: Comfortable with epidural. Reports headache resolved with Tylenol and continues to deny vision chgs, RUQ pain.    Objective: BP 134/78  Pulse 75  Temp(Src) 98.1 F (36.7 C) (Oral)  Resp 20  Ht 5\' 1"  (1.549 m)  Wt 73.936 kg (163 lb)  BMI 30.80 kg/m2  SpO2 100%  LMP 01/02/2011 I/O last 3 completed shifts: In: -  Out: 750 [Urine:750] Total I/O In: 1375.6 [P.O.:240; I.V.:1135.6] Out: -   FHT:  FHR: 140 bpm, variability: moderate,  accelerations:  Present,  decelerations:  Present Occas non-repetitive late decels noted.   UC:   regular, every 3-6 minutes SVE:   Deferred at present Only sm amt bloody show noted on pad. No further clots.  Pitocin at 4mu/min  Labs: Lab Results  Component Value Date   WBC 7.7 10/14/2011   HGB 12.3 10/14/2011   HCT 36.3 10/14/2011   MCV 95.5 10/14/2011   PLT 120* 10/14/2011    Assessment / Plan: IUP at 40w 5d Induction of labor due to SROM Elevated blood pressure  Labor: Active labor Preeclampsia:  no signs or symptoms of toxicity Fetal Wellbeing:  Category II Pain Control:  Epidural I/D:  n/a Anticipated MOD:  NSVD  Continue to observe carefully and titrate pitocin to achieve adequate contraction pattern.    Nikaela Coyne O. 10/14/2011, 11:34 AM

## 2011-10-14 NOTE — Op Note (Addendum)
Cesarean Section Procedure Note  Indications: Term with Gest HTN and probably preeclampsia undergoing 24hr urine.  Called by CNM and updated on lack of progress, no uop over last hour and repetitive late decelerations on pitocin.  Pitocin held, late decelerations resolved but still no uop.  Pt agreeable to proceed with C/S after the risks/ benefits and alternatives discussed.  Plan to continue 24hr urine and start MgSO4 once bleeding under control and pitocin initiated.  Plts slightly greater than 100K and Creatinine 2.0 from 1.1.  Anticipate resolution postop and will continue to monitor closely in AICU.  Pre-operative Diagnosis: Nonreassuring Fetal Heart Rate, Failure to Progress, PIH, Oliguria   Post-operative Diagnosis: Nonreassuring Fetal Heart Rate, Failure to Progress, PIH, Oliguria  Procedure: CESAREAN SECTION  Surgeon: Purcell Nails, MD    Assistants: Elsie Ra, CNM  Anesthesia: Regional  Anesthesiologist: Dana Allan, MD   Procedure Details  The patient was taken to the operating room secondary to FTP, NRFHT and oliguria after the risks, benefits, complications, treatment options, and expected outcomes were discussed with the patient.  The patient concurred with the proposed plan, giving informed consent which was signed and witnessed. The patient was taken to Operating Room 1, identified as Kathy Jenkins and the procedure verified as C-Section Delivery. A Time Out was held and the above information confirmed.  After induction of anesthesia by obtaining a spinal, the patient was prepped and draped in the usual sterile manner. A Pfannenstiel skin incision was made and carried down through the subcutaneous tissue to the underlying layer of fascia.  The fascia was incised bilaterally and extended transversely bilaterally with the Mayo scissors. Kocher clamps were placed on the inferior aspect of the fascial incision and the underlying rectus muscle was separated from the  fascia. The same was done on the superior aspect of the fascial incision.  The peritoneum was identified, entered bluntly and extended manually. The utero-vesical peritoneal reflection was incised transversely and the bladder flap was bluntly freed from the lower uterine segment. A low transverse uterine incision was made with the scalpel and extended bilaterally with the bandage scissors.  The infant was delivered in vertex position without difficulty in ROP presentation with cord down low near face.  After the umbilical cord was clamped and cut, the infant was handed to the awaiting pediatricians.  Cord blood was obtained for evaluation.  The placenta was removed intact and appeared to be within normal limits. The uterus was cleared of all clots and debris. The uterine incision was closed with running interlocking sutures of 0 Vicryl and a second imbricating layer was performed as well.   There was an extension on the left that was repaired in a similar fashion.  Bilateral tubes and ovaries appeared to be within normal limits.  Good hemostasis was noted.  Copious irrigation was performed until clear.  The peritoneum was repaired with 2-0 chromic via a running suture.  The fascia was reapproximated with a running suture of 0 Vicryl. The subcutaneous tissue was reapproximated with 3 interrupted sutures of 2-0 plain.  The skin was reapproximated with a subcuticular suture of 3-0 monocryl.  Steristrips were applied.  MgSO4 bolus given after pitocin started and bleeding under control.  Instrument, sponge, and needle counts were correct prior to abdominal closure and at the conclusion of the case.  The patient was awaiting transfer to the recovery room in good condition.  Findings: Live female infant with Apgars 9 at one minute and 9 at five minutes.  Normal appearing bilateral ovaries and fallopian tubes were noted.  Estimated Blood Loss:  600 ml         Drains: foley to gravity pink tinged of 100 cc           Total IV Fluids: 2200 ml         Specimens to Pathology: Placenta         Complications:  None; patient tolerated the procedure well.         Disposition: PACU - hemodynamically stable.         Condition: stable  Attending Attestation: I performed the procedure.

## 2011-10-14 NOTE — Anesthesia Procedure Notes (Signed)
Epidural Patient location during procedure: OB Start time: 10/14/2011 1:00 AM Reason for block: procedure for pain  Staffing Performed by: anesthesiologist   Preanesthetic Checklist Completed: patient identified, site marked, surgical consent, pre-op evaluation, timeout performed, IV checked, risks and benefits discussed and monitors and equipment checked  Epidural Patient position: sitting Prep: site prepped and draped and DuraPrep Patient monitoring: continuous pulse ox and blood pressure Approach: midline Injection technique: LOR air  Needle:  Needle type: Tuohy  Needle gauge: 17 G Needle length: 9 cm Needle insertion depth: 5 cm cm Catheter type: closed end flexible Catheter size: 19 Gauge Catheter at skin depth: 10 cm Test dose: negative  Assessment Events: blood not aspirated, injection not painful, no injection resistance, negative IV test and no paresthesia  Additional Notes Discussed risk of headache, infection, bleeding, nerve injury and failed or incomplete block.  Patient voices understanding and wishes to proceed.

## 2011-10-14 NOTE — Progress Notes (Signed)
Subjective: Pt comfortable s/p epidural around 0100.  She and husband are able to rest.  No PIH s/s.  Platelets did get rechecked before epidural and have dropped slightly more since admission.  Run of lates noted within 30 min of receiving epidural.  Resolved after position changes and Pitocin was d/c'd, however it had only been to max of 2mu at time of d/c.  Will restart now.  Objective: BP 128/71  Pulse 78  Temp(Src) 98.3 F (36.8 C) (Oral)  Resp 18  Ht 5\' 1"  (1.549 m)  Wt 163 lb (73.936 kg)  BMI 30.80 kg/m2  SpO2 100%  LMP 01/02/2011      FHT:  FHR: 135 bpm, variability: moderate,  accelerations:  Present,  decelerations:  Absent UC:   irregular, every 2-7 minutes SVE:   Dilation: 2 Effacement (%): 80 Station: -2 Exam by:: l.poore, rn Per RN  Labs: Lab Results  Component Value Date   WBC 7.7 10/14/2011   HGB 12.3 10/14/2011   HCT 36.3 10/14/2011   MCV 95.5 10/14/2011   PLT 120* 10/14/2011    Assessment / Plan: 1.  40.5  2.  PIH w/ thrombocytopenia & elevated LDH  3.  GBS neg    Labor: induction in progress Preeclampsia:  intake and ouput balanced Fetal Wellbeing:  Category I Pain Control:  Epidural I/D:  n/a Anticipated MOD:  NSVD 1.  Will restart Pitocin now, and continue to titrate as tolerated.  Recheck prn 2.  C/w MD prn 3.  CTO BP, FHT, and labs closely 4.  24 hr urine in progress through 2015 5/27 Michale Emmerich H 10/14/2011, 5:53 AM

## 2011-10-14 NOTE — Progress Notes (Signed)
Kathy Jenkins is a 31 y.o. G1P0000 at [redacted]w[redacted]d admitted for induction of labor due to Spontaneous rupture of BOW and elevated BP.  Subjective: Remains comfortable at present with epidural and sleeping at intervals.  Family at bedside.  Denies any toxic s/s at present.    Objective: BP 130/96  Pulse 79  Temp(Src) 98 F (36.7 C) (Oral)  Resp 20  Ht 5\' 1"  (1.549 m)  Wt 73.936 kg (163 lb)  BMI 30.80 kg/m2  SpO2 100%  LMP 01/02/2011 I/O last 3 completed shifts: In: -  Out: 750 [Urine:750] Total I/O In: 2679.7 [P.O.:840; I.V.:1839.7] Out: 230 [Urine:230]  FHT:  FHR: 145 bpm, variability: moderate,  accelerations:  Present,  decelerations:  Present Occas mild variable decel.  No further late decels noted at present.Occas early decel noted.  UC:   regular, every 1.5-4 minutes SVE:   Dilation: 7 Effacement (%): 90 Station: -2 Exam by:: Elsie Ra, CNM Sm amt bloody show continues but no frank vaginal bleeding or clots.  Foley with dark amber urine in bag.  24hr urine in progress.    AROM performed with mod amt clear to blood tinged fluid after RBA d/w pt who agrees to proceed. IUPC inserted without difficulty after RBA d/w pt who agrees to proceed. Pitocin at 41mu/min.  Labs: Lab Results  Component Value Date   WBC 7.7 10/14/2011   HGB 12.3 10/14/2011   HCT 36.3 10/14/2011   MCV 95.5 10/14/2011   PLT 120* 10/14/2011    Assessment / Plan: Protracted active phase  Labor: Protracted active phase of labor. Preeclampsia:  no signs or symptoms of toxicity Fetal Wellbeing:  Category I Pain Control:  Epidural I/D:  n/a Anticipated MOD:  NSVD  Will begin hourly output assessments and if urine output less than 30cc/hr, begin fluid bolus.   Continue to observe carefully. Titrate pitocin to achieve adequate MVUs.    Sula Fetterly O. 10/14/2011, 6:07 PM

## 2011-10-15 ENCOUNTER — Encounter (HOSPITAL_COMMUNITY): Payer: Self-pay | Admitting: Obstetrics and Gynecology

## 2011-10-15 ENCOUNTER — Encounter: Payer: 59 | Admitting: Obstetrics and Gynecology

## 2011-10-15 ENCOUNTER — Other Ambulatory Visit: Payer: 59

## 2011-10-15 DIAGNOSIS — Z98891 History of uterine scar from previous surgery: Secondary | ICD-10-CM

## 2011-10-15 DIAGNOSIS — O1490 Unspecified pre-eclampsia, unspecified trimester: Secondary | ICD-10-CM | POA: Diagnosis present

## 2011-10-15 LAB — PROTEIN, URINE, 24 HOUR
Collection Interval-UPROT: 24 hours
Protein, Urine: 118 mg/dL
Urine Total Volume-UPROT: 900 mL

## 2011-10-15 LAB — COMPREHENSIVE METABOLIC PANEL
Albumin: 2 g/dL — ABNORMAL LOW (ref 3.5–5.2)
BUN: 21 mg/dL (ref 6–23)
Calcium: 8.1 mg/dL — ABNORMAL LOW (ref 8.4–10.5)
Chloride: 99 mEq/L (ref 96–112)
Creatinine, Ser: 1.93 mg/dL — ABNORMAL HIGH (ref 0.50–1.10)
GFR calc non Af Amer: 34 mL/min — ABNORMAL LOW (ref >90)
Total Bilirubin: 0.6 mg/dL (ref 0.3–1.2)

## 2011-10-15 LAB — CBC
HCT: 28.6 % — ABNORMAL LOW (ref 36.0–46.0)
Hemoglobin: 9.8 g/dL — ABNORMAL LOW (ref 12.0–15.0)
WBC: 13.3 10*3/uL — ABNORMAL HIGH (ref 4.0–10.5)

## 2011-10-15 LAB — MAGNESIUM: Magnesium: 4 mg/dL — ABNORMAL HIGH (ref 1.5–2.5)

## 2011-10-15 LAB — ABO/RH: ABO/RH(D): O POS

## 2011-10-15 LAB — URIC ACID: Uric Acid, Serum: 8.1 mg/dL — ABNORMAL HIGH (ref 2.4–7.0)

## 2011-10-15 LAB — LACTATE DEHYDROGENASE: LDH: 267 U/L — ABNORMAL HIGH (ref 94–250)

## 2011-10-15 MED ORDER — DIPHENHYDRAMINE HCL 50 MG/ML IJ SOLN: 12.5000 mg | INTRAMUSCULAR | Status: DC | PRN

## 2011-10-15 MED ORDER — ONDANSETRON HCL 4 MG/2ML IJ SOLN: 4.0000 mg | INTRAMUSCULAR | Status: DC | PRN

## 2011-10-15 MED ORDER — LACTATED RINGERS IV SOLN
INTRAVENOUS | Status: DC
  Administered 2011-10-15 (×2): via INTRAVENOUS

## 2011-10-15 MED ORDER — ZOLPIDEM TARTRATE 5 MG PO TABS: 5.0000 mg | ORAL_TABLET | Freq: Every evening | ORAL | Status: DC | PRN

## 2011-10-15 MED ORDER — SODIUM CHLORIDE 0.9 % IV SOLN
1.0000 ug/kg/h | INTRAVENOUS | Status: DC | PRN
  Filled 2011-10-15: qty 2.5

## 2011-10-15 MED ORDER — SENNOSIDES-DOCUSATE SODIUM 8.6-50 MG PO TABS
2.0000 | ORAL_TABLET | Freq: Every day | ORAL | Status: DC
  Administered 2011-10-15 – 2011-10-16 (×2): 2 via ORAL

## 2011-10-15 MED ORDER — NALBUPHINE SYRINGE 5 MG/0.5 ML
5.0000 mg | INJECTION | INTRAMUSCULAR | Status: DC | PRN
  Filled 2011-10-15: qty 1

## 2011-10-15 MED ORDER — DIBUCAINE 1 % RE OINT: 1.0000 "application " | TOPICAL_OINTMENT | RECTAL | Status: DC | PRN

## 2011-10-15 MED ORDER — MEPERIDINE HCL 25 MG/ML IJ SOLN: 6.2500 mg | INTRAMUSCULAR | Status: DC | PRN

## 2011-10-15 MED ORDER — SIMETHICONE 80 MG PO CHEW
80.0000 mg | CHEWABLE_TABLET | Freq: Three times a day (TID) | ORAL | Status: DC
  Administered 2011-10-15 – 2011-10-17 (×9): 80 mg via ORAL

## 2011-10-15 MED ORDER — DIPHENHYDRAMINE HCL 25 MG PO CAPS
25.0000 mg | ORAL_CAPSULE | ORAL | Status: DC | PRN
  Filled 2011-10-15: qty 1

## 2011-10-15 MED ORDER — PRENATAL MULTIVITAMIN CH
1.0000 | ORAL_TABLET | Freq: Every day | ORAL | Status: DC
  Administered 2011-10-15 – 2011-10-17 (×3): 1 via ORAL
  Filled 2011-10-15 (×3): qty 1

## 2011-10-15 MED ORDER — METOCLOPRAMIDE HCL 5 MG/ML IJ SOLN: 10.0000 mg | Freq: Three times a day (TID) | INTRAMUSCULAR | Status: DC | PRN

## 2011-10-15 MED ORDER — DIPHENHYDRAMINE HCL 25 MG PO CAPS
25.0000 mg | ORAL_CAPSULE | Freq: Four times a day (QID) | ORAL | Status: DC | PRN
  Administered 2011-10-15: 25 mg via ORAL

## 2011-10-15 MED ORDER — NALBUPHINE HCL 10 MG/ML IJ SOLN
5.0000 mg | INTRAMUSCULAR | Status: DC | PRN
  Filled 2011-10-15: qty 1

## 2011-10-15 MED ORDER — OXYTOCIN 20 UNITS IN LACTATED RINGERS INFUSION - SIMPLE: 125.0000 mL/h | INTRAVENOUS | Status: AC

## 2011-10-15 MED ORDER — LANOLIN HYDROUS EX OINT: 1.0000 "application " | TOPICAL_OINTMENT | CUTANEOUS | Status: DC | PRN

## 2011-10-15 MED ORDER — WITCH HAZEL-GLYCERIN EX PADS: 1.0000 "application " | MEDICATED_PAD | CUTANEOUS | Status: DC | PRN

## 2011-10-15 MED ORDER — FENTANYL CITRATE 0.05 MG/ML IJ SOLN: 25.0000 ug | INTRAMUSCULAR | Status: DC | PRN

## 2011-10-15 MED ORDER — IBUPROFEN 600 MG PO TABS
600.0000 mg | ORAL_TABLET | Freq: Four times a day (QID) | ORAL | Status: DC
  Administered 2011-10-15 – 2011-10-17 (×9): 600 mg via ORAL
  Filled 2011-10-15 (×8): qty 1

## 2011-10-15 MED ORDER — OXYCODONE-ACETAMINOPHEN 5-325 MG PO TABS
1.0000 | ORAL_TABLET | ORAL | Status: DC | PRN
Start: 2011-10-15 — End: 2011-10-17
  Administered 2011-10-16: 1 via ORAL
  Filled 2011-10-15: qty 1

## 2011-10-15 MED ORDER — ONDANSETRON HCL 4 MG/2ML IJ SOLN: 4.0000 mg | Freq: Three times a day (TID) | INTRAMUSCULAR | Status: DC | PRN

## 2011-10-15 MED ORDER — NALOXONE HCL 0.4 MG/ML IJ SOLN: 0.4000 mg | INTRAMUSCULAR | Status: DC | PRN

## 2011-10-15 MED ORDER — DIPHENHYDRAMINE HCL 50 MG/ML IJ SOLN: 25.0000 mg | INTRAMUSCULAR | Status: DC | PRN

## 2011-10-15 MED ORDER — TETANUS-DIPHTH-ACELL PERTUSSIS 5-2.5-18.5 LF-MCG/0.5 IM SUSP
0.5000 mL | Freq: Once | INTRAMUSCULAR | Status: DC
  Filled 2011-10-15: qty 0.5

## 2011-10-15 MED ORDER — SIMETHICONE 80 MG PO CHEW: 80.0000 mg | CHEWABLE_TABLET | ORAL | Status: DC | PRN

## 2011-10-15 MED ORDER — SODIUM CHLORIDE 0.9 % IJ SOLN: 3.0000 mL | INTRAMUSCULAR | Status: DC | PRN

## 2011-10-15 MED ORDER — IBUPROFEN 600 MG PO TABS
600.0000 mg | ORAL_TABLET | Freq: Four times a day (QID) | ORAL | Status: DC | PRN
  Filled 2011-10-15: qty 1

## 2011-10-15 MED ORDER — MENTHOL 3 MG MT LOZG: 1.0000 | LOZENGE | OROMUCOSAL | Status: DC | PRN

## 2011-10-15 NOTE — Addendum Note (Signed)
Addendum  created 10/15/11 0747 by Graciela Husbands, CRNA   Modules edited:Notes Section

## 2011-10-15 NOTE — Progress Notes (Signed)
UR chart review completed.  

## 2011-10-15 NOTE — Anesthesia Postprocedure Evaluation (Signed)
  Anesthesia Post-op Note  Patient: Kathy Jenkins  Procedure(s) Performed: Procedure(s) (LRB): CESAREAN SECTION (N/A)  Patient Location: 373  Anesthesia Type: Spinal  Level of Consciousness: awake, alert  and oriented  Airway and Oxygen Therapy: Patient Spontanous Breathing  Post-op Pain: mild  Post-op Assessment: Post-op Vital signs reviewed, Patient's Cardiovascular Status Stable, No headache, No backache, No residual numbness and No residual motor weakness  Post-op Vital Signs: Reviewed and stable  Complications: No apparent anesthesia complications

## 2011-10-15 NOTE — Progress Notes (Signed)
Patient ID: Kathy Jenkins, female   DOB: 10-May-1981, 31 y.o.   MRN: [id number] Subjective: Postpartum Day 1: Cesarean Delivery Patient reports tolerating PO, + flatus, + BM and no problems voiding.   No headache or blurred vision  Objective: Vital signs in last 24 hours: Temp:  [97.8 F (36.6 C)-100 F (37.8 C)] 97.8 F (36.6 C) (05/28 1200) Pulse Rate:  [62-110] 83  (05/28 1313) Resp:  [11-25] 18  (05/28 1200) BP: (115-160)/(71-104) 137/81 mmHg (05/28 1313) SpO2:  [96 %-100 %] 100 % (05/28 1313)  Physical Exam:  General: alert Lochia: appropriate Uterine Fundus: firm Incision: healing well, no significant drainage DVT Evaluation: No evidence of DVT seen on physical exam.   Basename 10/15/11 0500 10/14/11 2248  HGB 9.8* 10.8*  HCT 28.6* 31.8*    Assessment/Plan: Status post Cesarean section. Doing well postoperatively.  Creatinine improving.  Urine output is adequate.  Pt has not voided since foley discontinued.  Will monitor and replace foley if unable to void Continue current care. T/c d/c of magnesium this evening Recheck labs in the morning Lopez Dentinger A 10/15/2011, 1:33 PM

## 2011-10-15 NOTE — Anesthesia Postprocedure Evaluation (Signed)
Anesthesia Post Note  Patient: Kathy Jenkins  Procedure(s) Performed: Procedure(s) (LRB): CESAREAN SECTION (N/A)  Anesthesia type: Epidural  Patient location: PACU  Post pain: Pain level controlled  Post assessment: Post-op Vital signs reviewed  Last Vitals:  Filed Vitals:   10/14/11 2345  BP: 142/91  Pulse: 90  Temp:   Resp: 16    Post vital signs: Reviewed  Level of consciousness: awake  Complications: No apparent anesthesia complications

## 2011-10-16 LAB — URIC ACID: Uric Acid, Serum: 8.4 mg/dL — ABNORMAL HIGH (ref 2.4–7.0)

## 2011-10-16 LAB — COMPREHENSIVE METABOLIC PANEL
Albumin: 1.9 g/dL — ABNORMAL LOW (ref 3.5–5.2)
BUN: 16 mg/dL (ref 6–23)
Calcium: 7.7 mg/dL — ABNORMAL LOW (ref 8.4–10.5)
Creatinine, Ser: 1.4 mg/dL — ABNORMAL HIGH (ref 0.50–1.10)
Total Protein: 4.4 g/dL — ABNORMAL LOW (ref 6.0–8.3)

## 2011-10-16 LAB — CBC
HCT: 25.3 % — ABNORMAL LOW (ref 36.0–46.0)
MCHC: 34 g/dL (ref 30.0–36.0)
MCV: 94.4 fL (ref 78.0–100.0)
Platelets: 116 10*3/uL — ABNORMAL LOW (ref 150–400)
RDW: 13.8 % (ref 11.5–15.5)

## 2011-10-16 LAB — LACTATE DEHYDROGENASE: LDH: 276 U/L — ABNORMAL HIGH (ref 94–250)

## 2011-10-16 NOTE — Progress Notes (Addendum)
Subjective: Postpartum Day 1 Cesarean Delivery Patient reports + flatus, + BM and no problems voiding, comfortable with pain meds.    Objective:  Results for orders placed during the hospital encounter of 10/13/11 (from the past 72 hour(s))  URINALYSIS, ROUTINE W REFLEX MICROSCOPIC     Status: Abnormal   Collection Time   10/13/11  3:30 PM      Component Value Range Comment   Color, Urine YELLOW  YELLOW     APPearance CLEAR  CLEAR     Specific Gravity, Urine <1.005 (*) 1.005 - 1.030     pH 6.0  5.0 - 8.0     Glucose, UA NEGATIVE  NEGATIVE (mg/dL)    Hgb urine dipstick SMALL (*) NEGATIVE     Bilirubin Urine NEGATIVE  NEGATIVE     Ketones, ur NEGATIVE  NEGATIVE (mg/dL)    Protein, ur NEGATIVE  NEGATIVE (mg/dL)    Urobilinogen, UA 0.2  0.0 - 1.0 (mg/dL)    Nitrite NEGATIVE  NEGATIVE     Leukocytes, UA TRACE (*) NEGATIVE    URINE MICROSCOPIC-ADD ON     Status: Abnormal   Collection Time   10/13/11  3:30 PM      Component Value Range Comment   Squamous Epithelial / LPF FEW (*) RARE     WBC, UA 0-2  <3 (WBC/hpf)    Bacteria, UA RARE  RARE    CBC     Status: Abnormal   Collection Time   10/13/11  4:01 PM      Component Value Range Comment   WBC 6.1  4.0 - 10.5 (K/uL)    RBC 3.80 (*) 3.87 - 5.11 (MIL/uL)    Hemoglobin 12.4  12.0 - 15.0 (g/dL)    HCT 46.9  62.9 - 52.8 (%)    MCV 95.3  78.0 - 100.0 (fL)    MCH 32.6  26.0 - 34.0 (pg)    MCHC 34.3  30.0 - 36.0 (g/dL)    RDW 41.3  24.4 - 01.0 (%)    Platelets 126 (*) 150 - 400 (K/uL)   COMPREHENSIVE METABOLIC PANEL     Status: Abnormal   Collection Time   10/13/11  4:01 PM      Component Value Range Comment   Sodium 132 (*) 135 - 145 (mEq/L)    Potassium 4.1  3.5 - 5.1 (mEq/L)    Chloride 102  96 - 112 (mEq/L)    CO2 19  19 - 32 (mEq/L)    Glucose, Bld 77  70 - 99 (mg/dL)    BUN 16  6 - 23 (mg/dL)    Creatinine, Ser 2.72 (*) 0.50 - 1.10 (mg/dL)    Calcium 8.9  8.4 - 10.5 (mg/dL)    Total Protein 6.0  6.0 - 8.3 (g/dL)    Albumin  2.6 (*) 3.5 - 5.2 (g/dL)    AST 29  0 - 37 (U/L)    ALT 15  0 - 35 (U/L)    Alkaline Phosphatase 194 (*) 39 - 117 (U/L)    Total Bilirubin 0.4  0.3 - 1.2 (mg/dL)    GFR calc non Af Amer 63 (*) >90 (mL/min)    GFR calc Af Amer 74 (*) >90 (mL/min)   URIC ACID     Status: Normal   Collection Time   10/13/11  4:01 PM      Component Value Range Comment   Uric Acid, Serum 6.9  2.4 - 7.0 (mg/dL)   LACTATE DEHYDROGENASE  Status: Abnormal   Collection Time   10/13/11  4:01 PM      Component Value Range Comment   LDH 374 (*) 94 - 250 (U/L) HEMOLYSIS AT THIS LEVEL MAY AFFECT RESULT  RPR     Status: Normal   Collection Time   10/13/11  6:40 PM      Component Value Range Comment   RPR NON REACTIVE  NON REACTIVE    PROTEIN, URINE, 24 HOUR     Status: Abnormal   Collection Time   10/13/11  8:47 PM      Component Value Range Comment   Urine Total Volume-UPROT 900      Collection Interval-UPROT 24      Protein, Urine 118      Protein, 24H Urine 1062 (*) 50 - 100 (mg/day)   ABO/RH     Status: Normal   Collection Time   10/14/11 12:28 AM      Component Value Range Comment   ABO/RH(D) O POS     CBC     Status: Abnormal   Collection Time   10/14/11 12:29 AM      Component Value Range Comment   WBC 7.7  4.0 - 10.5 (K/uL)    RBC 3.80 (*) 3.87 - 5.11 (MIL/uL)    Hemoglobin 12.3  12.0 - 15.0 (g/dL)    HCT 84.6  96.2 - 95.2 (%)    MCV 95.5  78.0 - 100.0 (fL)    MCH 32.4  26.0 - 34.0 (pg)    MCHC 33.9  30.0 - 36.0 (g/dL)    RDW 84.1  32.4 - 40.1 (%)    Platelets 120 (*) 150 - 400 (K/uL)   CBC     Status: Abnormal   Collection Time   10/14/11  8:05 PM      Component Value Range Comment   WBC 9.8  4.0 - 10.5 (K/uL)    RBC 3.97  3.87 - 5.11 (MIL/uL)    Hemoglobin 13.2  12.0 - 15.0 (g/dL)    HCT 02.7  25.3 - 66.4 (%)    MCV 96.2  78.0 - 100.0 (fL)    MCH 33.2  26.0 - 34.0 (pg)    MCHC 34.6  30.0 - 36.0 (g/dL)    RDW 40.3  47.4 - 25.9 (%)    Platelets 116 (*) 150 - 400 (K/uL)   COMPREHENSIVE  METABOLIC PANEL     Status: Abnormal   Collection Time   10/14/11  8:05 PM      Component Value Range Comment   Sodium 131 (*) 135 - 145 (mEq/L)    Potassium 3.9  3.5 - 5.1 (mEq/L)    Chloride 97  96 - 112 (mEq/L)    CO2 20  19 - 32 (mEq/L)    Glucose, Bld 76  70 - 99 (mg/dL)    BUN 22  6 - 23 (mg/dL)    Creatinine, Ser 5.63 (*) 0.50 - 1.10 (mg/dL)    Calcium 8.8  8.4 - 10.5 (mg/dL)    Total Protein 6.5  6.0 - 8.3 (g/dL)    Albumin 2.7 (*) 3.5 - 5.2 (g/dL)    AST 44 (*) 0 - 37 (U/L)    ALT 21  0 - 35 (U/L)    Alkaline Phosphatase 196 (*) 39 - 117 (U/L)    Total Bilirubin 0.8  0.3 - 1.2 (mg/dL)    GFR calc non Af Amer 31 (*) >90 (mL/min)    GFR calc Af  Amer 36 (*) >90 (mL/min)   LACTATE DEHYDROGENASE     Status: Abnormal   Collection Time   10/14/11  8:05 PM      Component Value Range Comment   LDH 267 (*) 94 - 250 (U/L) HEMOLYSIS AT THIS LEVEL MAY AFFECT RESULT  URIC ACID     Status: Abnormal   Collection Time   10/14/11  8:05 PM      Component Value Range Comment   Uric Acid, Serum 8.0 (*) 2.4 - 7.0 (mg/dL)   CBC     Status: Abnormal   Collection Time   10/14/11 10:48 PM      Component Value Range Comment   WBC 12.8 (*) 4.0 - 10.5 (K/uL)    RBC 3.34 (*) 3.87 - 5.11 (MIL/uL)    Hemoglobin 10.8 (*) 12.0 - 15.0 (g/dL)    HCT 47.8 (*) 29.5 - 46.0 (%)    MCV 95.2  78.0 - 100.0 (fL)    MCH 32.3  26.0 - 34.0 (pg)    MCHC 34.0  30.0 - 36.0 (g/dL)    RDW 62.1  30.8 - 65.7 (%)    Platelets 106 (*) 150 - 400 (K/uL)   MRSA PCR SCREENING     Status: Normal   Collection Time   10/15/11 12:48 AM      Component Value Range Comment   MRSA by PCR NEGATIVE  NEGATIVE    CBC     Status: Abnormal   Collection Time   10/15/11  5:00 AM      Component Value Range Comment   WBC 13.3 (*) 4.0 - 10.5 (K/uL)    RBC 3.02 (*) 3.87 - 5.11 (MIL/uL)    Hemoglobin 9.8 (*) 12.0 - 15.0 (g/dL)    HCT 84.6 (*) 96.2 - 46.0 (%)    MCV 94.7  78.0 - 100.0 (fL)    MCH 32.5  26.0 - 34.0 (pg)    MCHC 34.3   30.0 - 36.0 (g/dL)    RDW 95.2  84.1 - 32.4 (%)    Platelets 103 (*) 150 - 400 (K/uL)   COMPREHENSIVE METABOLIC PANEL     Status: Abnormal   Collection Time   10/15/11  5:00 AM      Component Value Range Comment   Sodium 132 (*) 135 - 145 (mEq/L)    Potassium 3.7  3.5 - 5.1 (mEq/L)    Chloride 99  96 - 112 (mEq/L)    CO2 21  19 - 32 (mEq/L)    Glucose, Bld 91  70 - 99 (mg/dL)    BUN 21  6 - 23 (mg/dL)    Creatinine, Ser 4.01 (*) 0.50 - 1.10 (mg/dL)    Calcium 8.1 (*) 8.4 - 10.5 (mg/dL)    Total Protein 4.9 (*) 6.0 - 8.3 (g/dL)    Albumin 2.0 (*) 3.5 - 5.2 (g/dL)    AST 41 (*) 0 - 37 (U/L)    ALT 20  0 - 35 (U/L)    Alkaline Phosphatase 133 (*) 39 - 117 (U/L)    Total Bilirubin 0.6  0.3 - 1.2 (mg/dL)    GFR calc non Af Amer 34 (*) >90 (mL/min)    GFR calc Af Amer 40 (*) >90 (mL/min)   URIC ACID     Status: Abnormal   Collection Time   10/15/11  5:00 AM      Component Value Range Comment   Uric Acid, Serum 8.1 (*) 2.4 - 7.0 (mg/dL)  LACTATE DEHYDROGENASE     Status: Abnormal   Collection Time   10/15/11  5:00 AM      Component Value Range Comment   LDH 267 (*) 94 - 250 (U/L)   MAGNESIUM     Status: Abnormal   Collection Time   10/15/11  5:00 AM      Component Value Range Comment   Magnesium 4.0 (*) 1.5 - 2.5 (mg/dL)   CBC     Status: Abnormal   Collection Time   10/16/11  5:45 AM      Component Value Range Comment   WBC 10.5  4.0 - 10.5 (K/uL)    RBC 2.68 (*) 3.87 - 5.11 (MIL/uL)    Hemoglobin 8.6 (*) 12.0 - 15.0 (g/dL)    HCT 62.1 (*) 30.8 - 46.0 (%)    MCV 94.4  78.0 - 100.0 (fL)    MCH 32.1  26.0 - 34.0 (pg)    MCHC 34.0  30.0 - 36.0 (g/dL)    RDW 65.7  84.6 - 96.2 (%)    Platelets 116 (*) 150 - 400 (K/uL)   COMPREHENSIVE METABOLIC PANEL     Status: Abnormal   Collection Time   10/16/11  5:45 AM      Component Value Range Comment   Sodium 137  135 - 145 (mEq/L)    Potassium 3.3 (*) 3.5 - 5.1 (mEq/L)    Chloride 103  96 - 112 (mEq/L)    CO2 27  19 - 32 (mEq/L)     Glucose, Bld 90  70 - 99 (mg/dL)    BUN 16  6 - 23 (mg/dL)    Creatinine, Ser 9.52 (*) 0.50 - 1.10 (mg/dL)    Calcium 7.7 (*) 8.4 - 10.5 (mg/dL)    Total Protein 4.4 (*) 6.0 - 8.3 (g/dL)    Albumin 1.9 (*) 3.5 - 5.2 (g/dL)    AST 39 (*) 0 - 37 (U/L)    ALT 18  0 - 35 (U/L)    Alkaline Phosphatase 133 (*) 39 - 117 (U/L)    Total Bilirubin 0.2 (*) 0.3 - 1.2 (mg/dL)    GFR calc non Af Amer 51 (*) >90 (mL/min)    GFR calc Af Amer 59 (*) >90 (mL/min)   LACTATE DEHYDROGENASE     Status: Abnormal   Collection Time   10/16/11  5:45 AM      Component Value Range Comment   LDH 276 (*) 94 - 250 (U/L)   URIC ACID     Status: Abnormal   Collection Time   10/16/11  5:45 AM      Component Value Range Comment   Uric Acid, Serum 8.4 (*) 2.4 - 7.0 (mg/dL)      Vital signs in last 24 hours: Temp:  [97.5 F (36.4 C)-98 F (36.7 C)] 97.6 F (36.4 C) (05/29 0804) Pulse Rate:  [72-100] 79  (05/29 0804) Resp:  [16-20] 18  (05/29 0804) BP: (121-150)/(65-101) 128/77 mmHg (05/29 0804) SpO2:  [98 %-100 %] 100 % (05/29 0804) Weight:  [161 lb 3.2 oz (73.12 kg)] 161 lb 3.2 oz (73.12 kg) (05/29 8413) S: comfortable, little bleeding, slept     breastfeeding Physical Exam:  General: alert, cooperative and no distress Lochia: appropriate Uterine Fundus: firm Incision: healing well DVT Evaluation: Negative Homan's sign. Calf/Ankle edema is present. +2 pitting edema lower legs Lungs clear bilaterally AP RRR Bowel sounds active abd tympanic, nt,  A normal involution     Lactating  PO day 1     Normal involution     Pre eclampsia P encouraged ambulation, continue care, collaboration with Dr. Estanislado Pandy. Lavera Guise, CNM Addendum: denies ha, visual spots or blurring, no epigastric pain, just with swelling to ankles not hands and face. Lavera Guise, CNM

## 2011-10-16 NOTE — Progress Notes (Signed)
0935 Pt transferred ambulatory to [facility] rm#[id number] Glennon Mac, RN updated.

## 2011-10-17 DIAGNOSIS — D649 Anemia, unspecified: Secondary | ICD-10-CM | POA: Diagnosis present

## 2011-10-17 MED ORDER — NORETHINDRONE 0.35 MG PO TABS: 1.0000 | ORAL_TABLET | Freq: Every day | ORAL | Status: DC

## 2011-10-17 MED ORDER — IBUPROFEN 600 MG PO TABS: 600.0000 mg | ORAL_TABLET | Freq: Four times a day (QID) | ORAL | Status: AC

## 2011-10-17 NOTE — Discharge Instructions (Signed)

## 2011-10-17 NOTE — Progress Notes (Signed)
Told midwife about cream-colored spot on tip on tongue. Pt denies pain, chills, discharge, redness or any other oral symptoms. VSS. Told to follow up if oral spot worsens or does not improve.

## 2011-10-17 NOTE — Discharge Summary (Signed)
Obstetric Discharge Summary Reason for Admission: rupture of membranes and elevation of BP Prenatal Procedures: NST and ultrasound Intrapartum Procedures: cesarean: low cervical, transverse due to FTP, non-reassuring FHR, pre-eclampsia Postpartum Procedures: Magnesium sulfate x 24 hours post delivery Complications-Operative and Postpartum: anemia Hemoglobin  Date Value Range Status  10/16/2011 8.6* 12.0-15.0 (g/dL) Final     HCT  Date Value Range Status  10/16/2011 25.3* 36.0-46.0 (%) Final   Hospital Course: Admitted 10/13/11 with SROM and elevated BP.  24 hour urine was begun, and PIH labs showed mild elevation of LDH and mild thrombocytopenia.  Pitocin was begun.  Negative GBS. Progressed to 7 cm, with no progress beyond that and late decels.  Primary LTCS was performed by Dr. Su Hilt without complication. Patient and baby tolerated the procedure without difficulty, and patient was placed on magnesium sulfate for 24 hours post delivery.  The 24 hour urine protein was 1062, and her serum creatinine reached a peak of 2.08 on 5/27, then was 1.93 on day 1, then 1.40 on day 2.  Previous to delivery, creatinine was 1.16.  Infant to FTN. Mother's BP normalized over the next 24 hours, with newborn doing well.  Breastfeeding was going well. Mom's physical exam was WNL, and she was desiring d/c home on day 2.  Contraception plan was Micronor.  She received adequate benefit from po pain medications.      Physical Exam:  General: alert Lochia: appropriate Uterine Fundus: firm Incision: healing well DVT Evaluation: No evidence of DVT seen on physical exam. Negative Homan's sign.  Discharge Diagnoses: Term Pregnancy-delivered, Preelampsia and anemia--stable  Discharge Information: Date: 10/17/2011 Activity: Per [facility] handout Diet: routine Medications: Ibuprofen, Iron and Percocet, Micronor Condition: stable Instructions: refer to practice specific booklet Discharge to: home Contraception:   Micronor  Follow-up Information    Follow up with [facility] in 6 weeks. (Call for appointment or as needed)          Newborn Data: Live born female  Birth Weight: 7 lb 15.3 oz (3610 g) APGAR: 9, 9  Home with mother.  Kathy Jenkins 10/17/2011, 8:45 AM

## 2012-06-19 ENCOUNTER — Ambulatory Visit: Payer: 59 | Admitting: Obstetrics and Gynecology

## 2012-06-19 ENCOUNTER — Encounter: Payer: Self-pay | Admitting: Obstetrics and Gynecology

## 2012-06-19 VITALS — BP 110/72 | Temp 99.0°F | Ht 61.0 in | Wt 127.0 lb

## 2012-06-19 DIAGNOSIS — Z124 Encounter for screening for malignant neoplasm of cervix: Secondary | ICD-10-CM

## 2012-06-19 DIAGNOSIS — IMO0001 Reserved for inherently not codable concepts without codable children: Secondary | ICD-10-CM

## 2012-06-19 DIAGNOSIS — Z01419 Encounter for gynecological examination (general) (routine) without abnormal findings: Secondary | ICD-10-CM

## 2012-06-19 MED ORDER — NORGESTIM-ETH ESTRAD TRIPHASIC 0.18/0.215/0.25 MG-25 MCG PO TABS: 1.0000 | ORAL_TABLET | Freq: Every day | ORAL | Status: DC

## 2012-06-19 NOTE — Progress Notes (Signed)
Regular Periods: yes Mammogram: no  Monthly Breast Ex.: yes Exercise: no  Tetanus < 10 years: yes Seatbelts: yes  NI. Bladder Functn.: yes Abuse at home: no  Daily BM's: yes Stressful Work: no  Healthy Diet: yes Sigmoid-Colonoscopy: no  Calcium: no Medical problems this year: want to discuss birth control    LAST PAP:11/12  Contraception: condoms  Mammogram:  no  PCP: NO   PMH: NO CHANGE  FMH: NO CHANGE  Last Bone Scan: NO  PT IS MARRIED.

## 2012-06-19 NOTE — Progress Notes (Signed)
Subjective:    Kathy Jenkins is a 32 y.o. female, G1P1001, who presents for an annual exam. The patient requests Ortho Tri Cyclen Lo and wants to resume.  Patient is not breastfeeding.  Menstrual cycle:   LMP: Patient's last menstrual period was 05/24/2012.             Review of Systems Pertinent items are noted in HPI. Denies pelvic pain, urinary tract symptoms, vaginitis symptoms, irregular bleeding, menopausal symptoms, change in bowel habits or rectal bleeding   Objective:    BP 110/72  Temp 99 F (37.2 C) (Oral)  Ht 5\' 1"  (1.549 m)  Wt 127 lb (57.607 kg)  BMI 24.00 kg/m2  LMP 05/24/2012     Wt Readings from Last 1 Encounters:  06/19/12 127 lb (57.607 kg)   Body mass index is 24.00 kg/(m^2). General Appearance: Alert, no acute distress HEENT: Grossly normal Neck / Thyroid: Supple, no thyromegaly or cervical adenopathy Lungs: Clear to auscultation bilaterally Back: No CVA tenderness Breast Exam: No masses or nodes.No dimpling, nipple retraction or discharge. Cardiovascular: Regular rate and rhythm.  Gastrointestinal: Soft, non-tender, no masses or organomegaly Pelvic Exam: EGBUS-wnl, vagina-normal rugae, cervix- (posterior) without lesions or tenderness, uterus appears normal size shape and consistency, adnexae-no masses or tenderness Lymphatic Exam: Non-palpable nodes in neck, clavicular,  axillary, or inguinal regions  Skin: no rashes or abnormalities Extremities: no clubbing cyanosis or edema  Neurologic: grossly normal Psychiatric: Alert and oriented   UPT-negative Assessment:   Routine GYN Exam   Plan:  Handout on Contraceptive Choices  Begin with next cycle Ortho Tri Cyclen Lo #1 1 po QD 11 refills BUM x 1st cycle of pills  PAP sent  RTO 1 year or prn  Khristen Cheyney,ELMIRAPA-C

## 2012-06-22 ENCOUNTER — Encounter: Payer: Self-pay | Admitting: Obstetrics and Gynecology

## 2012-06-25 ENCOUNTER — Telehealth: Payer: Self-pay | Admitting: Obstetrics and Gynecology

## 2012-06-25 NOTE — Telephone Encounter (Signed)
Tc to pt regarding msg.  Pt advised to get in contact with her insurance company and see which BCPs are covered @ 100% by her insurance.  Pt voices agreement.

## 2012-07-06 ENCOUNTER — Telehealth: Payer: Self-pay | Admitting: Obstetrics and Gynecology

## 2012-07-07 MED ORDER — NORGESTIM-ETH ESTRAD TRIPHASIC 0.18/0.215/0.25 MG-25 MCG PO TABS: 1.0000 | ORAL_TABLET | Freq: Every day | ORAL | Status: DC

## 2012-07-07 NOTE — Telephone Encounter (Signed)
Lm on vm rx sent to pharm

## 2013-09-01 LAB — OB RESULTS CONSOLE RUBELLA ANTIBODY, IGM: RUBELLA: IMMUNE

## 2013-09-01 LAB — OB RESULTS CONSOLE ABO/RH: RH Type: POSITIVE

## 2013-09-01 LAB — OB RESULTS CONSOLE HIV ANTIBODY (ROUTINE TESTING): HIV: NONREACTIVE

## 2013-09-01 LAB — OB RESULTS CONSOLE RPR: RPR: NONREACTIVE

## 2013-09-01 LAB — OB RESULTS CONSOLE HEPATITIS B SURFACE ANTIGEN: HEP B S AG: NEGATIVE

## 2013-09-01 LAB — OB RESULTS CONSOLE ANTIBODY SCREEN: ANTIBODY SCREEN: NEGATIVE

## 2013-09-10 LAB — OB RESULTS CONSOLE GC/CHLAMYDIA
Chlamydia: NEGATIVE
Gonorrhea: NEGATIVE

## 2014-03-14 ENCOUNTER — Other Ambulatory Visit: Payer: Self-pay | Admitting: Obstetrics and Gynecology

## 2014-03-21 ENCOUNTER — Encounter: Payer: Self-pay | Admitting: Obstetrics and Gynecology

## 2014-03-24 ENCOUNTER — Encounter (HOSPITAL_COMMUNITY): Payer: Self-pay

## 2014-03-25 ENCOUNTER — Encounter (HOSPITAL_COMMUNITY)
Admission: RE | Admit: 2014-03-25 | Discharge: 2014-03-25 | Disposition: A | Discharge status: Home / Self Care | Payer: 59 | Source: Ambulatory Visit | Attending: Obstetrics and Gynecology | Admitting: Obstetrics and Gynecology

## 2014-03-25 ENCOUNTER — Encounter (HOSPITAL_COMMUNITY): Payer: Self-pay

## 2014-03-25 LAB — CBC
HEMATOCRIT: 33.4 % — AB (ref 36.0–46.0)
HEMOGLOBIN: 11.5 g/dL — AB (ref 12.0–15.0)
MCH: 33 pg (ref 26.0–34.0)
MCHC: 34.4 g/dL (ref 30.0–36.0)
MCV: 96 fL (ref 78.0–100.0)
PLATELETS: 138 10*3/uL — AB (ref 150–400)
RBC: 3.48 MIL/uL — AB (ref 3.87–5.11)
RDW: 13.7 % (ref 11.5–15.5)
WBC: 6.4 10*3/uL (ref 4.0–10.5)

## 2014-03-25 LAB — TYPE AND SCREEN
ABO/RH(D): O POS
Antibody Screen: NEGATIVE

## 2014-03-25 LAB — RPR

## 2014-03-25 NOTE — Patient Instructions (Addendum)
Your procedure is scheduled on:03/28/14  Enter through the Main Entrance at : 8am Pick up desk phone and dial [phone] and inform us of your arrival.  Please call [phone] if you have any problems the morning of surgery.  Remember: Do not eat food or drink liquids, including water, after midnight:Sunday   You may brush your teeth the morning of surgery.   DO NOT wear jewelry, eye make-up, lipstick,body lotion, or dark fingernail polish.  (Polished toes are ok) You may wear deodorant.  If you are to be admitted after surgery, leave suitcase in car until your room has been assigned. Patients discharged on the day of surgery will not be allowed to drive home. Wear loose fitting, comfortable clothes for your ride home.

## 2014-03-25 NOTE — Pre-Procedure Instructions (Signed)
Dr. Sherron AlesK. Jackson notified of pt's platelets result of 138.

## 2014-03-28 ENCOUNTER — Inpatient Hospital Stay (HOSPITAL_COMMUNITY)
Admission: RE | Admit: 2014-03-28 | Discharge: 2014-03-31 | DRG: 765 | Disposition: A | Discharge status: Home / Self Care | Payer: 59 | Source: Ambulatory Visit | Attending: Obstetrics and Gynecology | Admitting: Obstetrics and Gynecology

## 2014-03-28 ENCOUNTER — Encounter (HOSPITAL_COMMUNITY): Payer: Self-pay | Admitting: *Deleted

## 2014-03-28 ENCOUNTER — Encounter (HOSPITAL_COMMUNITY)
Admission: RE | Disposition: A | Discharge status: Home / Self Care | Payer: Self-pay | Source: Ambulatory Visit | Attending: Obstetrics and Gynecology

## 2014-03-28 ENCOUNTER — Inpatient Hospital Stay (HOSPITAL_COMMUNITY): Payer: 59 | Admitting: Anesthesiology

## 2014-03-28 DIAGNOSIS — D696 Thrombocytopenia, unspecified: Secondary | ICD-10-CM | POA: Diagnosis present

## 2014-03-28 DIAGNOSIS — L309 Dermatitis, unspecified: Secondary | ICD-10-CM | POA: Diagnosis not present

## 2014-03-28 DIAGNOSIS — Z3A39 39 weeks gestation of pregnancy: Secondary | ICD-10-CM | POA: Diagnosis present

## 2014-03-28 DIAGNOSIS — O9902 Anemia complicating childbirth: Secondary | ICD-10-CM | POA: Diagnosis present

## 2014-03-28 DIAGNOSIS — D62 Acute posthemorrhagic anemia: Secondary | ICD-10-CM | POA: Diagnosis present

## 2014-03-28 DIAGNOSIS — O9912 Other diseases of the blood and blood-forming organs and certain disorders involving the immune mechanism complicating childbirth: Secondary | ICD-10-CM | POA: Diagnosis present

## 2014-03-28 DIAGNOSIS — O3421 Maternal care for scar from previous cesarean delivery: Principal | ICD-10-CM | POA: Diagnosis present

## 2014-03-28 SURGERY — Surgical Case
Anesthesia: Spinal | Site: Abdomen

## 2014-03-28 MED ORDER — ONDANSETRON HCL 4 MG/2ML IJ SOLN
INTRAMUSCULAR | Status: DC | PRN
  Administered 2014-03-28: 4 mg via INTRAVENOUS

## 2014-03-28 MED ORDER — DIPHENHYDRAMINE HCL 25 MG PO CAPS: 25.0000 mg | ORAL_CAPSULE | Freq: Four times a day (QID) | ORAL | Status: DC | PRN

## 2014-03-28 MED ORDER — SCOPOLAMINE 1 MG/3DAYS TD PT72
1.0000 | MEDICATED_PATCH | Freq: Once | TRANSDERMAL | Status: DC
  Filled 2014-03-28: qty 1

## 2014-03-28 MED ORDER — MEPERIDINE HCL 25 MG/ML IJ SOLN: 6.2500 mg | INTRAMUSCULAR | Status: DC | PRN

## 2014-03-28 MED ORDER — SODIUM CHLORIDE 0.9 % IJ SOLN: 3.0000 mL | Freq: Two times a day (BID) | INTRAMUSCULAR | Status: DC

## 2014-03-28 MED ORDER — BUPIVACAINE HCL (PF) 0.25 % IJ SOLN
INTRAMUSCULAR | Status: DC | PRN
  Administered 2014-03-28: 10 mL

## 2014-03-28 MED ORDER — MORPHINE SULFATE 0.5 MG/ML IJ SOLN
INTRAMUSCULAR | Status: AC
  Filled 2014-03-28: qty 10

## 2014-03-28 MED ORDER — NALBUPHINE HCL 10 MG/ML IJ SOLN: 5.0000 mg | Freq: Once | INTRAMUSCULAR | Status: AC | PRN

## 2014-03-28 MED ORDER — BUPIVACAINE HCL (PF) 0.25 % IJ SOLN
INTRAMUSCULAR | Status: AC
  Filled 2014-03-28: qty 30

## 2014-03-28 MED ORDER — SIMETHICONE 80 MG PO CHEW
80.0000 mg | CHEWABLE_TABLET | ORAL | Status: DC | PRN
Start: 2014-03-28 — End: 2014-03-31

## 2014-03-28 MED ORDER — FLEET ENEMA 7-19 GM/118ML RE ENEM: 1.0000 | ENEMA | Freq: Every day | RECTAL | Status: DC | PRN

## 2014-03-28 MED ORDER — METHYLERGONOVINE MALEATE 0.2 MG/ML IJ SOLN: 0.2000 mg | INTRAMUSCULAR | Status: DC | PRN

## 2014-03-28 MED ORDER — IBUPROFEN 600 MG PO TABS
600.0000 mg | ORAL_TABLET | Freq: Four times a day (QID) | ORAL | Status: DC
  Administered 2014-03-28 – 2014-03-31 (×11): 600 mg via ORAL
  Filled 2014-03-28 (×13): qty 1

## 2014-03-28 MED ORDER — NALOXONE HCL 1 MG/ML IJ SOLN
1.0000 ug/kg/h | INTRAVENOUS | Status: DC | PRN
  Filled 2014-03-28: qty 2

## 2014-03-28 MED ORDER — SIMETHICONE 80 MG PO CHEW
80.0000 mg | CHEWABLE_TABLET | ORAL | Status: DC
  Administered 2014-03-28 – 2014-03-30 (×3): 80 mg via ORAL
  Filled 2014-03-28 (×3): qty 1

## 2014-03-28 MED ORDER — FENTANYL CITRATE 0.05 MG/ML IJ SOLN
INTRAMUSCULAR | Status: AC
  Filled 2014-03-28: qty 2

## 2014-03-28 MED ORDER — ONDANSETRON HCL 4 MG/2ML IJ SOLN: 4.0000 mg | Freq: Three times a day (TID) | INTRAMUSCULAR | Status: DC | PRN

## 2014-03-28 MED ORDER — FENTANYL CITRATE 0.05 MG/ML IJ SOLN: 25.0000 ug | INTRAMUSCULAR | Status: DC | PRN

## 2014-03-28 MED ORDER — SODIUM CHLORIDE 0.9 % IJ SOLN: 3.0000 mL | INTRAMUSCULAR | Status: DC | PRN

## 2014-03-28 MED ORDER — DIPHENHYDRAMINE HCL 50 MG/ML IJ SOLN
INTRAMUSCULAR | Status: AC
  Filled 2014-03-28: qty 1

## 2014-03-28 MED ORDER — OXYCODONE-ACETAMINOPHEN 5-325 MG PO TABS: 1.0000 | ORAL_TABLET | ORAL | Status: DC | PRN

## 2014-03-28 MED ORDER — SODIUM CHLORIDE 0.9 % IV SOLN: 250.0000 mL | INTRAVENOUS | Status: DC

## 2014-03-28 MED ORDER — PRENATAL MULTIVITAMIN CH
1.0000 | ORAL_TABLET | Freq: Every day | ORAL | Status: DC
  Administered 2014-03-28 – 2014-03-31 (×4): 1 via ORAL
  Filled 2014-03-28 (×4): qty 1

## 2014-03-28 MED ORDER — FERROUS SULFATE 325 (65 FE) MG PO TABS
325.0000 mg | ORAL_TABLET | Freq: Two times a day (BID) | ORAL | Status: DC
  Administered 2014-03-28 – 2014-03-31 (×6): 325 mg via ORAL
  Filled 2014-03-28 (×6): qty 1

## 2014-03-28 MED ORDER — BISACODYL 10 MG RE SUPP: 10.0000 mg | Freq: Every day | RECTAL | Status: DC | PRN

## 2014-03-28 MED ORDER — ONDANSETRON HCL 4 MG/2ML IJ SOLN: 4.0000 mg | INTRAMUSCULAR | Status: DC | PRN

## 2014-03-28 MED ORDER — SIMETHICONE 80 MG PO CHEW
80.0000 mg | CHEWABLE_TABLET | Freq: Three times a day (TID) | ORAL | Status: DC
  Administered 2014-03-28 – 2014-03-31 (×7): 80 mg via ORAL
  Filled 2014-03-28 (×8): qty 1

## 2014-03-28 MED ORDER — LACTATED RINGERS IV SOLN
INTRAVENOUS | Status: DC
  Administered 2014-03-28 (×4): via INTRAVENOUS

## 2014-03-28 MED ORDER — MORPHINE SULFATE (PF) 0.5 MG/ML IJ SOLN
INTRAMUSCULAR | Status: DC | PRN
  Administered 2014-03-28: .15 mg via INTRATHECAL

## 2014-03-28 MED ORDER — LACTATED RINGERS IV SOLN
INTRAVENOUS | Status: DC | PRN
  Administered 2014-03-28: 10:00:00 via INTRAVENOUS

## 2014-03-28 MED ORDER — PHENYLEPHRINE 8 MG IN D5W 100 ML (0.08MG/ML) PREMIX OPTIME
INJECTION | INTRAVENOUS | Status: DC | PRN
  Administered 2014-03-28: 60 ug/min via INTRAVENOUS

## 2014-03-28 MED ORDER — NALOXONE HCL 0.4 MG/ML IJ SOLN: 0.4000 mg | INTRAMUSCULAR | Status: DC | PRN

## 2014-03-28 MED ORDER — NALBUPHINE HCL 10 MG/ML IJ SOLN: 5.0000 mg | INTRAMUSCULAR | Status: DC | PRN

## 2014-03-28 MED ORDER — DIPHENHYDRAMINE HCL 50 MG/ML IJ SOLN
12.5000 mg | INTRAMUSCULAR | Status: DC | PRN
  Administered 2014-03-28: 12.5 mg via INTRAVENOUS

## 2014-03-28 MED ORDER — 0.9 % SODIUM CHLORIDE (POUR BTL) OPTIME
TOPICAL | Status: DC | PRN
  Administered 2014-03-28: 1000 mL

## 2014-03-28 MED ORDER — ONDANSETRON HCL 4 MG PO TABS
4.0000 mg | ORAL_TABLET | ORAL | Status: DC | PRN
Start: 2014-03-28 — End: 2014-03-31

## 2014-03-28 MED ORDER — DIPHENHYDRAMINE HCL 25 MG PO CAPS
25.0000 mg | ORAL_CAPSULE | ORAL | Status: DC | PRN
  Filled 2014-03-28: qty 1

## 2014-03-28 MED ORDER — MIDAZOLAM HCL 2 MG/2ML IJ SOLN: 0.5000 mg | Freq: Once | INTRAMUSCULAR | Status: DC | PRN

## 2014-03-28 MED ORDER — PHENYLEPHRINE 8 MG IN D5W 100 ML (0.08MG/ML) PREMIX OPTIME
INJECTION | INTRAVENOUS | Status: AC
  Filled 2014-03-28: qty 100

## 2014-03-28 MED ORDER — ACETAMINOPHEN 500 MG PO TABS
1000.0000 mg | ORAL_TABLET | Freq: Four times a day (QID) | ORAL | Status: AC
  Administered 2014-03-28: 1000 mg via ORAL
  Filled 2014-03-28: qty 2

## 2014-03-28 MED ORDER — CEFAZOLIN SODIUM-DEXTROSE 2-3 GM-% IV SOLR
INTRAVENOUS | Status: AC
  Filled 2014-03-28: qty 50

## 2014-03-28 MED ORDER — PROMETHAZINE HCL 25 MG/ML IJ SOLN: 6.2500 mg | INTRAMUSCULAR | Status: DC | PRN

## 2014-03-28 MED ORDER — ZOLPIDEM TARTRATE 5 MG PO TABS: 5.0000 mg | ORAL_TABLET | Freq: Every evening | ORAL | Status: DC | PRN

## 2014-03-28 MED ORDER — OXYTOCIN 10 UNIT/ML IJ SOLN
INTRAMUSCULAR | Status: AC
  Filled 2014-03-28: qty 4

## 2014-03-28 MED ORDER — METHYLERGONOVINE MALEATE 0.2 MG PO TABS: 0.2000 mg | ORAL_TABLET | ORAL | Status: DC | PRN

## 2014-03-28 MED ORDER — OXYTOCIN 40 UNITS IN LACTATED RINGERS INFUSION - SIMPLE MED
INTRAVENOUS | Status: DC | PRN
  Administered 2014-03-28: 40 [IU] via INTRAVENOUS

## 2014-03-28 MED ORDER — SCOPOLAMINE 1 MG/3DAYS TD PT72
MEDICATED_PATCH | TRANSDERMAL | Status: AC
  Administered 2014-03-28: 1.5 mg via TRANSDERMAL
  Filled 2014-03-28: qty 1

## 2014-03-28 MED ORDER — OXYTOCIN 40 UNITS IN LACTATED RINGERS INFUSION - SIMPLE MED: 62.5000 mL/h | INTRAVENOUS | Status: AC

## 2014-03-28 MED ORDER — DIBUCAINE 1 % RE OINT: 1.0000 "application " | TOPICAL_OINTMENT | RECTAL | Status: DC | PRN

## 2014-03-28 MED ORDER — SENNOSIDES-DOCUSATE SODIUM 8.6-50 MG PO TABS
2.0000 | ORAL_TABLET | ORAL | Status: DC
  Administered 2014-03-28 – 2014-03-30 (×3): 2 via ORAL
  Filled 2014-03-28 (×3): qty 2

## 2014-03-28 MED ORDER — ONDANSETRON HCL 4 MG/2ML IJ SOLN
INTRAMUSCULAR | Status: AC
  Filled 2014-03-28: qty 2

## 2014-03-28 MED ORDER — IBUPROFEN 600 MG PO TABS: 600.0000 mg | ORAL_TABLET | Freq: Four times a day (QID) | ORAL | Status: DC | PRN

## 2014-03-28 MED ORDER — SCOPOLAMINE 1 MG/3DAYS TD PT72
1.0000 | MEDICATED_PATCH | Freq: Once | TRANSDERMAL | Status: DC
  Administered 2014-03-28: 1.5 mg via TRANSDERMAL

## 2014-03-28 MED ORDER — LANOLIN HYDROUS EX OINT: 1.0000 "application " | TOPICAL_OINTMENT | CUTANEOUS | Status: DC | PRN

## 2014-03-28 MED ORDER — FENTANYL CITRATE 0.05 MG/ML IJ SOLN
INTRAMUSCULAR | Status: DC | PRN
  Administered 2014-03-28: 25 ug via INTRATHECAL

## 2014-03-28 MED ORDER — DEXTROSE IN LACTATED RINGERS 5 % IV SOLN
INTRAVENOUS | Status: DC
  Administered 2014-03-28: 1 mL via INTRAVENOUS

## 2014-03-28 MED ORDER — MENTHOL 3 MG MT LOZG: 1.0000 | LOZENGE | OROMUCOSAL | Status: DC | PRN

## 2014-03-28 MED ORDER — CEFAZOLIN SODIUM-DEXTROSE 2-3 GM-% IV SOLR
2.0000 g | INTRAVENOUS | Status: AC
  Administered 2014-03-28: 2 g via INTRAVENOUS

## 2014-03-28 MED ORDER — SCOPOLAMINE 1 MG/3DAYS TD PT72
MEDICATED_PATCH | TRANSDERMAL | Status: AC
  Filled 2014-03-28: qty 1

## 2014-03-28 MED ORDER — WITCH HAZEL-GLYCERIN EX PADS: 1.0000 "application " | MEDICATED_PAD | CUTANEOUS | Status: DC | PRN

## 2014-03-28 MED ORDER — BUPIVACAINE IN DEXTROSE 0.75-8.25 % IT SOLN
INTRATHECAL | Status: DC | PRN
  Administered 2014-03-28: 1.4 mL via INTRATHECAL

## 2014-03-28 MED ORDER — OXYCODONE-ACETAMINOPHEN 5-325 MG PO TABS: 2.0000 | ORAL_TABLET | ORAL | Status: DC | PRN

## 2014-03-28 SURGICAL SUPPLY — 41 items
BARRIER ADHS 3X4 INTERCEED (GAUZE/BANDAGES/DRESSINGS) ×3 IMPLANT
BENZOIN TINCTURE PRP APPL 2/3 (GAUZE/BANDAGES/DRESSINGS) ×3 IMPLANT
CLAMP CORD UMBIL (MISCELLANEOUS) ×3 IMPLANT
CLOSURE WOUND 1/2 X4 (GAUZE/BANDAGES/DRESSINGS) ×1
CLOTH BEACON ORANGE TIMEOUT ST (SAFETY) ×3 IMPLANT
CONTAINER PREFILL 10% NBF 15ML (MISCELLANEOUS) IMPLANT
DRAPE SHEET LG 3/4 BI-LAMINATE (DRAPES) ×3 IMPLANT
DRSG OPSITE POSTOP 4X10 (GAUZE/BANDAGES/DRESSINGS) ×3 IMPLANT
DURAPREP 26ML APPLICATOR (WOUND CARE) ×3 IMPLANT
ELECT REM PT RETURN 9FT ADLT (ELECTROSURGICAL) ×3
ELECTRODE REM PT RTRN 9FT ADLT (ELECTROSURGICAL) ×1 IMPLANT
EXTRACTOR VACUUM M CUP 4 TUBE (SUCTIONS) IMPLANT
EXTRACTOR VACUUM M CUP 4' TUBE (SUCTIONS)
GLOVE BIOGEL PI IND STRL 7.0 (GLOVE) ×1 IMPLANT
GLOVE BIOGEL PI INDICATOR 7.0 (GLOVE) ×2
GLOVE ECLIPSE 6.5 STRL STRAW (GLOVE) ×3 IMPLANT
GOWN STRL REUS W/TWL LRG LVL3 (GOWN DISPOSABLE) ×9 IMPLANT
KIT ABG SYR 3ML LUER SLIP (SYRINGE) IMPLANT
NEEDLE HYPO 25X1 1.5 SAFETY (NEEDLE) ×3 IMPLANT
NEEDLE HYPO 25X5/8 SAFETYGLIDE (NEEDLE) IMPLANT
NS IRRIG 1000ML POUR BTL (IV SOLUTION) ×3 IMPLANT
PACK C SECTION WH (CUSTOM PROCEDURE TRAY) ×3 IMPLANT
PAD OB MATERNITY 4.3X12.25 (PERSONAL CARE ITEMS) ×3 IMPLANT
RTRCTR C-SECT PINK 25CM LRG (MISCELLANEOUS) ×3 IMPLANT
STAPLER VISISTAT 35W (STAPLE) IMPLANT
STRIP CLOSURE SKIN 1/2X4 (GAUZE/BANDAGES/DRESSINGS) ×2 IMPLANT
SUT CHROMIC GUT AB #0 18 (SUTURE) IMPLANT
SUT MNCRL 0 VIOLET CTX 36 (SUTURE) ×3 IMPLANT
SUT MON AB 4-0 PS1 27 (SUTURE) ×3 IMPLANT
SUT MONOCRYL 0 CTX 36 (SUTURE) ×6
SUT PLAIN 2 0 (SUTURE) ×2
SUT PLAIN 2 0 XLH (SUTURE) IMPLANT
SUT PLAIN ABS 2-0 CT1 27XMFL (SUTURE) ×1 IMPLANT
SUT VIC AB 0 CT1 36 (SUTURE) ×6 IMPLANT
SUT VIC AB 2-0 CT1 27 (SUTURE) ×2
SUT VIC AB 2-0 CT1 TAPERPNT 27 (SUTURE) ×1 IMPLANT
SUT VIC AB 4-0 PS2 27 (SUTURE) ×3 IMPLANT
SYR CONTROL 10ML LL (SYRINGE) ×3 IMPLANT
TOWEL OR 17X24 6PK STRL BLUE (TOWEL DISPOSABLE) ×3 IMPLANT
TRAY FOLEY CATH 14FR (SET/KITS/TRAYS/PACK) ×3 IMPLANT
WATER STERILE IRR 1000ML POUR (IV SOLUTION) IMPLANT

## 2014-03-28 NOTE — Plan of Care (Signed)
Problem: Phase II Progression Outcomes Goal: Afebrile, VS remain stable Outcome: Completed/Met Date Met:  03/28/14 Goal: Rh isoimmunization per orders Outcome: Not Applicable Date Met:  91/69/45

## 2014-03-28 NOTE — Anesthesia Procedure Notes (Signed)
Spinal Patient location during procedure: OR Start time: 03/28/2014 9:25 AM Staffing Anesthesiologist: Brayton CavesJACKSON, Binyamin Nelis Performed by: anesthesiologist  Preanesthetic Checklist Completed: patient identified, site marked, surgical consent, pre-op evaluation, timeout performed, IV checked, risks and benefits discussed and monitors and equipment checked Spinal Block Patient position: sitting Prep: DuraPrep Patient monitoring: heart rate, cardiac monitor, continuous pulse ox and blood pressure Approach: midline Location: L3-4 Injection technique: single-shot Needle Needle type: Sprotte  Needle gauge: 24 G Needle length: 9 cm Assessment Sensory level: T4 Additional Notes Patient identified.  Risk benefits discussed including failed block, incomplete pain control, headache, nerve damage, paralysis, blood pressure changes, nausea, vomiting, reactions to medication both toxic or allergic, and postpartum back pain.  Patient expressed understanding and wished to proceed.  All questions were answered.  Sterile technique used throughout procedure.  CSF was clear.  No parasthesia or other complications.  Please see nursing notes for vital signs.

## 2014-03-28 NOTE — Anesthesia Preprocedure Evaluation (Addendum)
Anesthesia Evaluation  Patient identified by MRN, date of birth, ID band Patient awake    Reviewed: Allergy & Precautions, H&P , NPO status , Patient's Chart, lab work & pertinent test results  Airway Mallampati: II       Dental   Pulmonary  breath sounds clear to auscultation        Cardiovascular Exercise Tolerance: Good Rhythm:regular Rate:Normal     Neuro/Psych    GI/Hepatic   Endo/Other    Renal/GU      Musculoskeletal   Abdominal   Peds  Hematology  (+) anemia ,   Anesthesia Other Findings   Reproductive/Obstetrics (+) Pregnancy                             Anesthesia Physical Anesthesia Plan  ASA: II  Anesthesia Plan: Spinal   Post-op Pain Management:    Induction:   Airway Management Planned:   Additional Equipment:   Intra-op Plan:   Post-operative Plan:   Informed Consent: I have reviewed the patients History and Physical, chart, labs and discussed the procedure including the risks, benefits and alternatives for the proposed anesthesia with the patient or authorized representative who has indicated his/her understanding and acceptance.     Plan Discussed with: Anesthesiologist, CRNA and Surgeon  Anesthesia Plan Comments:         Anesthesia Quick Evaluation  

## 2014-03-28 NOTE — Anesthesia Postprocedure Evaluation (Signed)
  Anesthesia Post-op Note  Anesthesia Post Note  Patient: Kathy Jenkins  Procedure(s) Performed: Procedure(s) (LRB): Repeat CESAREAN SECTION (N/A)  Anesthesia type: Spinal  Patient location: PACU  Post pain: Pain level controlled  Post assessment: Post-op Vital signs reviewed  Last Vitals:  Filed Vitals:   03/28/14 1215  BP:   Pulse: 71  Temp:   Resp: 10    Post vital signs: Reviewed  Level of consciousness: awake  Complications: No apparent anesthesia complications

## 2014-03-28 NOTE — Lactation Note (Signed)
This note was copied from the chart of Boy Orvan SeenStephanie Repetto. Lactation Consultation Note  Patient Name: Boy Orvan SeenStephanie Belcher NWGNF'AToday's Date: 03/28/2014 Reason for consult: Initial assessment of this mom and baby at 10 hours postpartum. This is mom's second child.  She states that her first child is 31 yo and breastfed for 6 months.  She also states that her newborn is latching well and she knows how to hand express colostrum/milk if needed.  LC encouraged frequent STS and cue feedings. LC encouraged review of Baby and Me pp 9, 14 and 20-25 for STS and BF information. LC provided [employer] Resource brochure and reviewed [facility] services and list of community and web site resources.      Maternal Data Formula Feeding for Exclusion: No Has patient been taught Hand Expression?: Yes (experienced mom; states she knows how to hand express) Does the patient have breastfeeding experience prior to this delivery?: Yes  Feeding Feeding Type: Breast Fed Length of feed: 20 min  LATCH Score/Interventions        initial LATCH score=8 and baby has had 3 feedings since delivery, as well as first void and first stool              Lactation Tools Discussed/Used   STS, cue feedings, hand expression  Consult Status Consult Status: Follow-up Date: 03/29/14 Follow-up type: In-patient    Warrick ParisianBryant, Carely Nappier [facility] 03/28/2014, 8:48 PM

## 2014-03-28 NOTE — Plan of Care (Signed)
Problem: Phase I Progression Outcomes Goal: Foley catheter patent Outcome: Completed/Met Date Met:  03/28/14 Goal: OOB as tolerated unless otherwise ordered Outcome: Completed/Met Date Met:  03/28/14 Goal: IS, TCDB as ordered Outcome: Completed/Met Date Met:  03/28/14 Goal: VS, stable, temp < 100.4 degrees F Outcome: Completed/Met Date Met:  03/28/14 Goal: Initial discharge plan identified Outcome: Completed/Met Date Met:  03/28/14     

## 2014-03-28 NOTE — Plan of Care (Signed)
Problem: Phase I Progression Outcomes Goal: Pain controlled with appropriate interventions Outcome: Completed/Met Date Met:  03/28/14     

## 2014-03-28 NOTE — Transfer of Care (Signed)
Immediate Anesthesia Transfer of Care Note  Patient: Kathy Jenkins  Procedure(s) Performed: Procedure(s) with comments: Repeat CESAREAN SECTION (N/A) - EDD: 04/03/14  Patient Location: PACU  Anesthesia Type:Spinal  Level of Consciousness: awake, alert , oriented and patient cooperative  Airway & Oxygen Therapy: Patient Spontanous Breathing  Post-op Assessment: Report given to PACU RN and Post -op Vital signs reviewed and stable  Post vital signs: Reviewed and stable  Complications: No apparent anesthesia complications

## 2014-03-28 NOTE — Brief Op Note (Signed)
03/28/2014  11:03 AM  PATIENT:  Ardith DarkStephanie B Durley  33 y.o. female  PRE-OPERATIVE DIAGNOSIS:  Previous Cesarean Section, term gestation  POST-OPERATIVE DIAGNOSIS:  Previous Cesarean Section, term gestation  PROCEDURE:  Repeat Cesarean section, kerr hysterotomy  SURGEON:  Surgeon(s) and Role:    * Ame Heagle Cathie BeamsA Ketura Sirek, MD - Primary  PHYSICIAN ASSISTANT:   ASSISTANTS: Raelyn Moraolitta Dawson, CNM   ANESTHESIA:   spinal  Findings: live female nl tubes and ovaries, ant placenta EBL:  Total I/O In: 3200 [I.V.:3200] Out: 1925 [Urine:425; Blood:1500]    BLOOD ADMINISTERED:none  DRAINS: none   LOCAL MEDICATIONS USED:  MARCAINE     SPECIMEN:  No Specimen  DISPOSITION OF SPECIMEN:  N/A  COUNTS:  YES  TOURNIQUET:  * No tourniquets in log *  DICTATION: .Other Dictation: Dictation Number [id number]  PLAN OF CARE: Admit to inpatient   PATIENT DISPOSITION:  PACU - hemodynamically stable.   Delay start of Pharmacological VTE agent (>24hrs) due to surgical blood loss or risk of bleeding: no

## 2014-03-29 ENCOUNTER — Encounter (HOSPITAL_COMMUNITY): Payer: Self-pay | Admitting: *Deleted

## 2014-03-29 DIAGNOSIS — D62 Acute posthemorrhagic anemia: Secondary | ICD-10-CM | POA: Diagnosis not present

## 2014-03-29 LAB — CBC
HCT: 28.3 % — ABNORMAL LOW (ref 36.0–46.0)
Hemoglobin: 9.8 g/dL — ABNORMAL LOW (ref 12.0–15.0)
MCH: 33 pg (ref 26.0–34.0)
MCHC: 34.6 g/dL (ref 30.0–36.0)
MCV: 95.3 fL (ref 78.0–100.0)
PLATELETS: 120 10*3/uL — AB (ref 150–400)
RBC: 2.97 MIL/uL — ABNORMAL LOW (ref 3.87–5.11)
RDW: 13.7 % (ref 11.5–15.5)
WBC: 9.1 10*3/uL (ref 4.0–10.5)

## 2014-03-29 LAB — BIRTH TISSUE RECOVERY COLLECTION (PLACENTA DONATION)

## 2014-03-29 MED ORDER — MAGNESIUM OXIDE 400 (241.3 MG) MG PO TABS
200.0000 mg | ORAL_TABLET | Freq: Every day | ORAL | Status: DC
  Administered 2014-03-29: 10:00:00 via ORAL
  Administered 2014-03-30 – 2014-03-31 (×2): 200 mg via ORAL
  Filled 2014-03-29 (×3): qty 0.5

## 2014-03-29 NOTE — Progress Notes (Signed)
POD # 1  Subjective: Pt reports feeling well/ Pain controlled with Motrin and Percocet Tolerating po/ Foley d/c'd and voiding without problems/ No n/v/ Flatus present Activity: ad lib Bleeding is light Newborn info:  Information for the patient's newborn:  Eartha InchRookwood, Boy Prescious [[id number]][[id number]]  female   Circumcision: planning/ Feeding: breast   Objective:  VS:  Filed Vitals:   03/29/14 0230 03/29/14 0728 03/29/14 0900 03/29/14 1014  BP: 119/63 113/77 101/72 101/72  Pulse: 86 84 80 80  Temp: 98.7 F (37.1 C) 98.5 F (36.9 C) 97.6 F (36.4 C) 97.6 F (36.4 C)  TempSrc: Oral Oral Oral Oral  Resp: 20 18 20 20   SpO2: 97% 98% 99%      I&O: Intake/Output      11/09 0701 - 11/10 0700 11/10 0701 - 11/11 0700   P.O. 1010    I.V. 3200    Total Intake 4210     Urine 4725    Blood 1680    Total Output 6405     Net -2195             Recent Labs  03/29/14 0555  WBC 9.1  HGB 9.8*  HCT 28.3*  PLT 120*    Blood type: --/--/O POS (11/06 1405) Rubella: Immune (04/15 0000)    Physical Exam:  General: alert and cooperative CV: Regular rate and rhythm Resp: CTA bilaterally Abdomen: soft, nontender, normal bowel sounds Incision: healing well, no drainage, no erythema, no hernia, no seroma, no swelling, well approximated, honeycomb dsg c/d/i Uterine Fundus: firm, below umbilicus, nontender Lochia: minimal Ext: extremities normal, atraumatic, no cyanosis or edema and Homans sign is negative, no sign of DVT    Assessment: POD # 1/ G2P2002/ S/P C/Section d/t repeat  ABL anemia Doing well  Plan: Ambulate Continue routine post op orders   Signed: Donette LarryBHAMBRI, Jaquaya Coyle, N, MSN, CNM 03/29/2014, 10:35 AM

## 2014-03-29 NOTE — Plan of Care (Signed)
Problem: Phase I Progression Outcomes Goal: Voiding adequately Outcome: Completed/Met Date Met:  03/29/14     

## 2014-03-29 NOTE — Plan of Care (Signed)
Problem: Discharge Progression Outcomes Goal: Remove staples per MD order Outcome: Not Applicable Date Met:  58/44/17 No staples in place Goal: MMR given as ordered Outcome: Not Applicable Date Met:  12/78/71 Rubella immune

## 2014-03-29 NOTE — Plan of Care (Signed)
Problem: Phase II Progression Outcomes Goal: Tolerating diet Outcome: Completed/Met Date Met:  03/29/14     

## 2014-03-29 NOTE — Op Note (Signed)
[name]:  [place], Phillis          ACCOUNT NO.:  [id number]  MEDICAL RECORD NO.:  [id number]  LOCATION:  9133                          FACILITY:  [facility]  PHYSICIAN:  Maxie BetterSheronette Areyanna Figeroa, M.D.DATE OF BIRTH:  [birth year]  DATE OF PROCEDURE:  03/28/2014 DATE OF DISCHARGE:                              OPERATIVE REPORT   PREOPERATIVE DIAGNOSIS:  Previous cesarean section, term gestation.  PROCEDURE: repeat cesarean section, Kerr hysterotomy.  POSTOPERATIVE DIAGNOSIS:  Previous cesarean section, term gestation.  ANESTHESIA:  Spinal.  SURGEON:  Maxie BetterSheronette Etter Royall, MD  ASSISTANT:  Raelyn Moraolitta Dawson, CNM  DESCRIPTION OF PROCEDURE:  Under adequate spinal anesthesia, the patient was placed in a supine position with a left lateral tilt.  She was sterilely prepped and draped in usual fashion and indwelling Foley catheter was sterilely placed.  A 0.25% Marcaine was injected along the previous Pfannenstiel skin incision.  Pfannenstiel skin incision was then made, carried down to the rectus fascia.  The rectus fascia was opened transversely.  Rectus fascia was carefully dissected off the rectus muscle in a superior and inferior fashion.  The parietal peritoneum was opened and it's incision was extended allowing for further dissection of the rectus fascia off of the rectus muscles superiorly.  Once this was done, the vesicouterine peritoneum was extended further.  Alexis retractor was then placed.  The bladder had some adhesions in the lower uterine segment.  The vesicouterine peritoneum was opened transversely and carefully dissected off the lower uterine segment and displaced inferiorly.  A curvilinear low transverse uterine incision was then made and extended with bandage scissors. Artificial rupture of membranes occurred.  Clear amniotic fluid noted. Subsequent delivery of a live female infant to the loop of cord next to the chest was accomplished.  The baby was bulb suctioned in the abdomen. The  cord was clamped, cut.  The baby was transferred to the awaiting pediatrician who assigned Apgars of 9 and 9 at one and five minutes respectively.  The placenta was intact and removed from its anterior position and passed on for cord blood donation.  Uterine cavity was cleaned of debris.  Uterine incision had no extension, was closed in 2 layers, the first layer with 0 Monocryl running locked stitch, second layer was imbricated using 0 Monocryl suture.  Normal tubes and ovaries were noted bilaterally.  The abdomen was irrigated and suctioned. Interceed was placed over the lower uterine segment.  The Alexis retractor was removed.  The parietal peritoneum was closed with 2-0 Vicryl.  The rectus fascia was closed with 0 Vicryl x2.  The subcutaneous area was irrigated, small bleeders cauterized and interrupted 2-0 plain sutures placed. The skin was approximated using 4-0 Monocryl suture.  SPECIMEN:  Placenta not sent to Pathology.  ESTIMATED BLOOD LOSS:  1 L.  URINE OUTPUT:  425 mL.  INTRAOPERATIVE FLUID:  3200 mL.  Sponge and instrument counts x2 was correct.  COMPLICATION:  None.  The patient was transferred to recovery in stable condition.  The baby was placed skin-to-skin in the operating room throughout the procedure.     Maxie BetterSheronette Mattisyn Cardona, M.D.     [place]/MEDQ  D:  03/28/2014  T:  03/29/2014  Job:  [id number]

## 2014-03-29 NOTE — Anesthesia Postprocedure Evaluation (Signed)
  Anesthesia Post-op Note  Anesthesia Post Note  Patient: Kathy Jenkins  Procedure(s) Performed: Procedure(s) (LRB): Repeat CESAREAN SECTION (N/A)  Anesthesia type: Spinal  Patient location: Mother/Baby  Post pain: Pain level controlled  Post assessment: Post-op Vital signs reviewed  Last Vitals:  Filed Vitals:   03/29/14 0728  BP: 113/77  Pulse: 84  Temp: 36.9 C  Resp: 18    Post vital signs: Reviewed  Level of consciousness: awake  Complications: No apparent anesthesia complications

## 2014-03-29 NOTE — Addendum Note (Signed)
Addendum  created 03/29/14 0827 by Turner DanielsJennifer L Dama Hedgepeth, CRNA   Modules edited: Notes Section   Notes Section:  File: [id number]

## 2014-03-29 NOTE — Plan of Care (Signed)
Problem: Phase II Progression Outcomes Goal: Progress activity as tolerated unless otherwise ordered Outcome: Completed/Met Date Met:  03/29/14     

## 2014-03-29 NOTE — Lactation Note (Signed)
This note was copied from the chart of [name]. Lactation Consultation Note: Mother is independently latching infant . She states he is cluster feeding. Observed mother in laid back position with infant in cradle hold. Observed infant with multiple audible swallows. Mother states she is seeing colostrum. Advised to continue to hand express before and after feedings. Reviewed supply and demand and infants need to cluster feed. Advised mother to continue to cue base feed . Reviewed Baby and me book. Mother receptive to all teaching.   Patient Name: [name] ONGEX'BToday's Date: 03/29/2014 Reason for consult: Follow-up assessment   Maternal Data    Feeding Feeding Type: Breast Fed Length of feed: 25 min  LATCH Score/Interventions Latch: Grasps breast easily, tongue down, lips flanged, rhythmical sucking.  Audible Swallowing: Spontaneous and intermittent  Type of Nipple: Everted at rest and after stimulation  Comfort (Breast/Nipple): Soft / non-tender     Hold (Positioning): No assistance needed to correctly position infant at breast. Intervention(s): Support Pillows;Position options  LATCH Score: 10  Lactation Tools Discussed/Used     Consult Status      Michel BickersKendrick, Lamonica Trueba McCoy 03/29/2014, 11:21 AM

## 2014-03-30 NOTE — Progress Notes (Signed)
POSTOPERATIVE DAY # 2 S/P C/S   S:         Reports feeling good             Tolerating po intake / no nausea / no vomiting / + flatus / no BM             Bleeding is light             Pain controlled withprescription NSAID's including Ibuprofen             Up ad lib / ambulatory/ voiding QS  Newborn breast feeding  / Circumcision today   O:  VS: BP 102/60 mmHg  Pulse 90  Temp(Src) 98.8 F (37.1 C) (Oral)  Resp 16  Ht 5\' 1"  (1.549 m)  Wt 71.215 kg (157 lb)  BMI 29.68 kg/m2  SpO2 98%  LMP 06/27/2013  Breastfeeding? Unknown   LABS:               Recent Labs  03/29/14 0555  WBC 9.1  HGB 9.8*  PLT 120*               Bloodtype: --/--/O POS (11/06 1405)  Rubella: Immune (04/15 0000)                                Physical Exam:             Alert and Oriented X3  Lungs: Clear and unlabored  Heart: regular rate and rhythm / no mumurs  Abdomen: soft, non-tender, distended             Fundus: firm, non-tender, U - 2             Dressing: honeycomb dressing clean, dry, and intact              Incision:  approximated with stutures / no erythema / no ecchymosis / no drainage  Perineum: intact  Lochia: small, bright red bleeding, no clots noted  Extremities: non-pitting edema, no calf pain or tenderness bilaterally  A:        POD # 2 S/P C/S            ABL Anemia - on Iron               P:        Routine postoperative care              Anticipate discharge home tomorrow             Discussed post-circumcision care for newborn   Milinda CaveMeredith Chelcie Estorga, SNM

## 2014-03-31 MED ORDER — IBUPROFEN 600 MG PO TABS: 600.0000 mg | ORAL_TABLET | Freq: Four times a day (QID) | ORAL | Status: DC | PRN

## 2014-03-31 MED ORDER — OXYCODONE-ACETAMINOPHEN 5-325 MG PO TABS: 1.0000 | ORAL_TABLET | ORAL | Status: DC | PRN

## 2014-03-31 MED ORDER — MAGNESIUM OXIDE 400 (241.3 MG) MG PO TABS: 200.0000 mg | ORAL_TABLET | Freq: Every day | ORAL | Status: DC

## 2014-03-31 MED ORDER — FERROUS SULFATE 325 (65 FE) MG PO TABS: 325.0000 mg | ORAL_TABLET | Freq: Two times a day (BID) | ORAL | Status: DC

## 2014-03-31 NOTE — Plan of Care (Signed)
Problem: Discharge Progression Outcomes Goal: Afebrile, VS remain stable at discharge Outcome: Completed/Met Date Met:  03/31/14 Goal: Discharge plan in place and appropriate Outcome: Completed/Met Date Met:  03/31/14

## 2014-03-31 NOTE — Progress Notes (Signed)
Patient ID: Kathy Jenkins, female   DOB: 08/22/1980, 33 y.o.   MRN: [id number] Subjective: POD# 3 Information for the patient's newborn:  Eartha InchRookwood, Boy Dickie [[id number]][[id number]]  female  / circ done  Reports feeling well Feeding: breast Patient reports tolerating PO.  Breast symptoms: none Pain controlled with ibuprofen (OTC) and narcotic analgesics including Percocet Denies HA/SOB/C/P/N/V/dizziness. Flatus present. Small BM. She reports vaginal bleeding as normal, without clots.  She is ambulating, urinating without difficult.     Objective:   VS:  Filed Vitals:   03/29/14 1823 03/30/14 0551 03/30/14 1800 03/31/14 0600  BP: 114/65 102/60 110/60 110/75  Pulse: 100 90 89 90  Temp: 99 F (37.2 C) 98.8 F (37.1 C) 98.6 F (37 C) 98.3 F (36.8 C)  TempSrc:  Oral Oral   Resp: 19 16 18 18   Height:      Weight:      SpO2:  98%      No intake or output data in the 24 hours ending 03/31/14 1226      Recent Labs  03/29/14 0555  WBC 9.1  HGB 9.8*  HCT 28.3*  PLT 120*     Blood type: --/--/O POS (11/06 1405)  Rubella: Immune (04/15 0000)     Physical Exam:  General: alert, cooperative and no distress Abdomen: soft, nontender Incision: Honeycomb dressing C/D/I - contact dermatitis on Lt edge of Tegaderm Uterine Fundus: firm, 2 FB below umbilicus, nontender Lochia: minimal Ext: edema trace and Homans sign is negative, no sign of DVT   Assessment/Plan: 33 y.o.   POD# 3.  s/p Cesarean Delivery.  Indications: Repeat                Principal Problem:   Postpartum care following cesarean delivery (11/9) Active Problems:   Acute blood loss anemia Contact Dermatitis  Doing well, stable.               Regular diet as tolerated Honeycomb dressing removed Ambulate Routine post-op care D/C Home today  Kenard GowerDAWSON, Jonelle Bann, M, MSN, CNM 03/31/2014, 12:26 PM

## 2014-03-31 NOTE — Lactation Note (Signed)
This note was copied from the chart of Kathy Orvan SeenStephanie Mcglinchey. Lactation Consultation Note: Follow up visit with mom before DC. Mom reports that baby just finished feeding for 20 min. Baby asleep on mom's chest. Reports that her breasts are feeling fuller today. No questions at present. To call prn  Patient Name: Kathy Jenkins ZOXWR'UToday's Date: 03/31/2014 Reason for consult: Follow-up assessment   Maternal Data Formula Feeding for Exclusion: No Has patient been taught Hand Expression?: Yes Does the patient have breastfeeding experience prior to this delivery?: Yes  Feeding Feeding Type: Breast Fed Length of feed: 10 min  LATCH Score/Interventions Latch: Repeated attempts needed to sustain latch, nipple held in mouth throughout feeding, stimulation needed to elicit sucking reflex. Intervention(s): Adjust position  Audible Swallowing: A few with stimulation Intervention(s): Skin to skin  Type of Nipple: Everted at rest and after stimulation  Comfort (Breast/Nipple): Soft / non-tender     Hold (Positioning): No assistance needed to correctly position infant at breast.  LATCH Score: 8  Lactation Tools Discussed/Used     Consult Status Consult Status: Complete    Pamelia HoitWeeks, Areesha Dehaven D 03/31/2014, 9:54 AM

## 2014-03-31 NOTE — Discharge Summary (Signed)
POSTOPERATIVE DISCHARGE SUMMARY:  Patient ID: Kathy Jenkins MRN: [id number] DOB/AGE: 31/09/1980 31 y.o.  Admit date: 03/28/2014 Admission Diagnoses: Previous Cesarean section, term gestation   Discharge date:   Discharge Diagnoses: S/P C/S due to previous LTCS on 03/28/2014, gestational thrombocytopenia        Prenatal history: G2P2002   EDC : 04/03/2014, by Last Menstrual Period  [facility] since 9.[redacted] wks gestation. Primary provider : Dr. Cherly Hensenousins Prenatal course complicated by previous cesarean delivery  Prenatal Labs: ABO, Rh: --/--/O POS (11/06 1405)  Antibody: NEG (11/06 1405) Rubella: Immune (04/15 0000)  RPR: NON REAC (11/06 1405)  HBsAg: Negative (04/15 0000)  HIV: Non-reactive (04/15 0000)  GTT : Normal GBS:  Negative  Medical / Surgical History :  Past medical history:  Past Medical History  Diagnosis Date  . H/O varicella   . H/O candidiasis     Past surgical history:  Past Surgical History  Procedure Laterality Date  . Cesarean section  10/14/2011    Procedure: CESAREAN SECTION;  Surgeon: Purcell NailsAngela Y Roberts, MD;  Location: [facility];  Service: Gynecology;  Laterality: N/A;  Primary Cesarean Section Delivery Baby  Boy @   2058,   Apgars 9/9  . Cesarean section N/A 03/28/2014    Procedure: Repeat CESAREAN SECTION;  Surgeon: Serita KyleSheronette A Netty Sullivant, MD;  Location: [facility];  Service: Obstetrics;  Laterality: N/A;  EDD: 04/03/14     Allergies: Review of patient's allergies indicates no known allergies.    Physical Exam:   VSS: Blood pressure 110/75, pulse 90, temperature 98.3 F (36.8 C), temperature source Oral, resp. rate 18, height 5\' 1"  (1.549 m), weight 71.215 kg (157 lb), last menstrual period 06/27/2013, SpO2 98 %, currently breastfeeding. Hospital course. Pt underwent Rpt C/S. See operative report. Uncomplicated postoperative course LABS:  Recent Labs  03/29/14 0555  WBC 9.1  HGB 9.8*  PLT 120*     Newborn Data Live born female  Birth Weight: 8 lb 3.9 oz (3740 g) APGAR: 8, 9  See operative report for further details  Home with mother. circ done Discharge Instructions:  Wound Care: keep clean and dry / remove honeycomb POD 7 Postpartum Instructions: [facility] discharge booklet - instructions reviewed Medications:    Medication List    STOP taking these medications        Norgestimate-Ethinyl Estradiol Triphasic 0.18/0.215/0.25 MG-25 MCG tab  Commonly known as:  ORTHO TRI-CYCLEN LO      TAKE these medications        ferrous sulfate 325 (65 FE) MG tablet  Take 1 tablet (325 mg total) by mouth 2 (two) times daily with a meal.     ibuprofen 600 MG tablet  Commonly known as:  ADVIL,MOTRIN  Take 1 tablet (600 mg total) by mouth every 6 (six) hours as needed for mild pain.     magnesium oxide 400 (241.3 MG) MG tablet  Commonly known as:  MAG-OX  Take 0.5 tablets (200 mg total) by mouth daily.     oxyCODONE-acetaminophen 5-325 MG per tablet  Commonly known as:  PERCOCET/ROXICET  Take 1 tablet by mouth every 4 (four) hours as needed (for pain scale less than 7).     prenatal multivitamin Tabs tablet  Take 1 tablet by mouth daily.           Signed: Kenard GowerAWSON, ROLITTA, M, MSN, CNM 03/31/2014, 12:34 PM

## 2014-03-31 NOTE — Plan of Care (Signed)
Problem: Phase I Progression Outcomes Goal: Other Phase I Outcomes/Goals Outcome: Completed/Met Date Met:  03/31/14  Problem: Discharge Progression Outcomes Goal: Activity appropriate for discharge plan Outcome: Completed/Met Date Met:  03/31/14 Goal: Tolerating diet Outcome: Completed/Met Date Met:  03/31/14 Goal: Pain controlled with appropriate interventions Outcome: Completed/Met Date Met:  03/31/14

## 2019-03-19 MED FILL — VIT D2 1.25 MG (50,000 UNIT: 1.25 MG | 28 days supply | Qty: 4 | Fill #0

## 2019-06-08 MED FILL — VIT D2 1.25 MG (50,000 UNIT: 1.25 MG | 28 days supply | Qty: 4 | Fill #0

## 2019-07-09 MED FILL — VIT D2 1.25 MG (50,000 UNIT: 1.25 MG | 28 days supply | Qty: 4 | Fill #1

## 2019-09-03 MED FILL — VIT D2 1.25 MG (50,000 UNIT: 1.25 MG | 28 days supply | Qty: 4 | Fill #2

## 2019-12-15 ENCOUNTER — Ambulatory Visit
Admission: EM | Admit: 2019-12-15 | Discharge: 2019-12-15 | Disposition: A | Discharge status: Home / Self Care | Payer: No Typology Code available for payment source | Attending: Physician Assistant | Admitting: Physician Assistant

## 2019-12-15 ENCOUNTER — Other Ambulatory Visit: Payer: Self-pay

## 2019-12-15 DIAGNOSIS — N3090 Cystitis, unspecified without hematuria: Secondary | ICD-10-CM | POA: Diagnosis present

## 2019-12-15 LAB — POCT URINALYSIS DIP (MANUAL ENTRY)
Bilirubin, UA: NEGATIVE
Glucose, UA: NEGATIVE mg/dL
Ketones, POC UA: NEGATIVE mg/dL
Nitrite, UA: NEGATIVE
Protein Ur, POC: 100 mg/dL — AB
Spec Grav, UA: 1.02 (ref 1.010–1.025)
Urobilinogen, UA: 0.2 E.U./dL
pH, UA: 6.5 (ref 5.0–8.0)

## 2019-12-15 MED ORDER — CEPHALEXIN 500 MG PO CAPS: 500.0000 mg | ORAL_CAPSULE | Freq: Two times a day (BID) | ORAL | 0 refills | Status: AC

## 2019-12-15 NOTE — ED Triage Notes (Signed)
Pt c/o pain on urination with urgency and frequency x2 days. States hx of same.

## 2019-12-15 NOTE — Discharge Instructions (Signed)
Your urine was positive for an urinary tract infection. Start keflex as directed. Keep hydrated, urine should be clear to pale yellow in color. Monitor for any worsening of symptoms, fever, worsening abdominal pain, nausea/vomiting, flank pain, follow up for reevaluation.  ° °

## 2019-12-15 NOTE — ED Provider Notes (Signed)
[facility]    CSN: [id number] Arrival date & time: 12/15/19  1741      History   Chief Complaint Chief Complaint  Patient presents with   Urinary Tract Infection    HPI Kathy Jenkins is a 39 y.o. female.   39 year old female comes in for 2 day history of urinary symptoms. Urgency, frequency, dysuria. Suprapubic pressure at times.  Denies abdominal pain, nausea, vomiting. Denies fever, chills, flank/back pain. Denies vaginal discharge, itching, spotting. IUD without cycles     Past Medical History:  Diagnosis Date   H/O candidiasis    H/O varicella     Patient Active Problem List   Diagnosis Date Noted   Acute blood loss anemia 03/29/2014   Postpartum care following cesarean delivery (11/9) 03/28/2014   Anemia 10/17/2011   S/P cesarean section 10/15/2011    Past Surgical History:  Procedure Laterality Date   CESAREAN SECTION  10/14/2011   Procedure: CESAREAN SECTION;  Surgeon: Purcell Nails, MD;  Location: [facility];  Service: Gynecology;  Laterality: N/A;  Primary Cesarean Section Delivery Baby  Boy @   [phone],   Apgars 9/9   CESAREAN SECTION N/A 03/28/2014   Procedure: Repeat CESAREAN SECTION;  Surgeon: Serita Kyle, MD;  Location: [facility];  Service: Obstetrics;  Laterality: N/A;  EDD: 04/03/14    OB History    Gravida  2   Para  2   Term  2   Preterm  0   AB  0   Living  2     SAB      TAB      Ectopic  0   Multiple  0   Live Births  2            Home Medications    Prior to Admission medications   Medication Sig Start Date End Date Taking? Authorizing Provider  cephALEXin (KEFLEX) 500 MG capsule Take 1 capsule (500 mg total) by mouth 2 (two) times daily. 12/15/19   Belinda Fisher, PA-C    Family History Family History  Problem Relation Age of Onset   Hypertension Mother    Cancer Mother    Hypertension Father    Asthma Brother    Heart disease Maternal Aunt    Heart disease Maternal Uncle      Diabetes Maternal Grandmother    Hypertension Paternal Grandmother     Social History Social History   Tobacco Use   Smoking status: Never Smoker   Smokeless tobacco: Never Used  Substance Use Topics   Alcohol use: Yes    Comment: occ   Drug use: No     Allergies   Patient has no known allergies.   Review of Systems Review of Systems  Reason unable to perform ROS: See HPI as above.     Physical Exam Triage Vital Signs ED Triage Vitals  Enc Vitals Group     BP 12/15/19 1759 123/80     Pulse Rate 12/15/19 1759 72     Resp 12/15/19 1759 16     Temp 12/15/19 1759 98.5 F (36.9 C)     Temp Source 12/15/19 1759 Oral     SpO2 12/15/19 1759 98 %     Weight --      Height --      Head Circumference --      Peak Flow --      Pain Score 12/15/19 1814 2     Pain  Loc --      Pain Edu? --      Excl. in GC? --    No data found.  Updated Vital Signs BP 123/80 (BP Location: Left Arm)    Pulse 72    Temp 98.5 F (36.9 C) (Oral)    Resp 16    SpO2 98%    Breastfeeding No   Physical Exam Constitutional:      General: She is not in acute distress.    Appearance: She is well-developed. She is not ill-appearing, toxic-appearing or diaphoretic.  HENT:     Head: Normocephalic and atraumatic.  Eyes:     Conjunctiva/sclera: Conjunctivae normal.     Pupils: Pupils are equal, round, and reactive to light.  Cardiovascular:     Rate and Rhythm: Normal rate and regular rhythm.  Pulmonary:     Effort: Pulmonary effort is normal. No respiratory distress.     Comments: LCTAB Abdominal:     General: Bowel sounds are normal.     Palpations: Abdomen is soft.     Tenderness: There is no abdominal tenderness. There is no right CVA tenderness, left CVA tenderness, guarding or rebound.  Musculoskeletal:     Cervical back: Normal range of motion and neck supple.  Skin:    General: Skin is warm and dry.  Neurological:     Mental Status: She is alert and oriented to person,  place, and time.  Psychiatric:        Behavior: Behavior normal.        Judgment: Judgment normal.      UC Treatments / Results  Labs (all labs ordered are listed, but only abnormal results are displayed) Labs Reviewed  POCT URINALYSIS DIP (MANUAL ENTRY) - Abnormal; Notable for the following components:      Result Value   Clarity, UA cloudy (*)    Blood, UA large (*)    Protein Ur, POC =100 (*)    Leukocytes, UA Moderate (2+) (*)    All other components within normal limits  URINE CULTURE    EKG   Radiology No results found.  Procedures Procedures (including critical care time)  Medications Ordered in UC Medications - No data to display  Initial Impression / Assessment and Plan / UC Course  I have reviewed the triage vital signs and the nursing notes.  Pertinent labs & imaging results that were available during my care of the patient were reviewed by me and considered in my medical decision making (see chart for details).    Urine dipstick positive for UTI. Start antibiotics as directed. Push fluids. Return precautions given.  Final Clinical Impressions(s) / UC Diagnoses   Final diagnoses:  Cystitis    ED Prescriptions    Medication Sig Dispense Auth. Provider   cephALEXin (KEFLEX) 500 MG capsule Take 1 capsule (500 mg total) by mouth 2 (two) times daily. 10 capsule Belinda Fisher, PA-C     PDMP not reviewed this encounter.   Belinda Fisher, PA-C 12/15/19 1958

## 2019-12-18 LAB — URINE CULTURE: Culture: 90000 — AB

## 2020-03-14 ENCOUNTER — Other Ambulatory Visit (HOSPITAL_COMMUNITY): Payer: Self-pay | Admitting: Internal Medicine

## 2020-03-15 MED FILL — VIT D2 1.25 MG (50,000 UNIT: 1.25 MG | 28 days supply | Qty: 4 | Fill #0

## 2020-06-13 MED FILL — VIT D2 1.25 MG (50,000 UNIT: 1.25 MG | 28 days supply | Qty: 4 | Fill #0

## 2020-07-28 MED FILL — VIT D2 1.25 MG (50,000 UNIT: 1.25 MG | 28 days supply | Qty: 4 | Fill #1

## 2020-08-07 ENCOUNTER — Other Ambulatory Visit: Payer: Self-pay | Admitting: Internal Medicine

## 2020-08-07 DIAGNOSIS — Z0000 Encounter for general adult medical examination without abnormal findings: Secondary | ICD-10-CM

## 2020-08-10 ENCOUNTER — Ambulatory Visit
Admission: RE | Admit: 2020-08-10 | Discharge: 2020-08-10 | Disposition: A | Discharge status: Home / Self Care | Payer: 59 | Source: Ambulatory Visit | Attending: Internal Medicine | Admitting: Internal Medicine

## 2020-08-10 ENCOUNTER — Other Ambulatory Visit: Payer: Self-pay | Admitting: Internal Medicine

## 2020-08-10 ENCOUNTER — Other Ambulatory Visit: Payer: Self-pay

## 2020-08-10 DIAGNOSIS — Z1231 Encounter for screening mammogram for malignant neoplasm of breast: Secondary | ICD-10-CM

## 2020-08-15 ENCOUNTER — Other Ambulatory Visit: Payer: Self-pay | Admitting: Internal Medicine

## 2020-08-15 DIAGNOSIS — R928 Other abnormal and inconclusive findings on diagnostic imaging of breast: Secondary | ICD-10-CM

## 2020-09-05 ENCOUNTER — Ambulatory Visit
Admission: RE | Admit: 2020-09-05 | Discharge: 2020-09-05 | Disposition: A | Discharge status: Home / Self Care | Payer: 59 | Source: Ambulatory Visit | Attending: Internal Medicine | Admitting: Internal Medicine

## 2020-09-05 ENCOUNTER — Other Ambulatory Visit: Payer: Self-pay

## 2020-09-05 DIAGNOSIS — R928 Other abnormal and inconclusive findings on diagnostic imaging of breast: Secondary | ICD-10-CM

## 2022-03-07 IMAGING — MG MM DIGITAL SCREENING BILAT W/ TOMO AND CAD
8 series · 9 of 24 positions shown · non-contrast
Comparison: None.

CLINICAL DATA: Screening.

EXAM:
DIGITAL SCREENING BILATERAL MAMMOGRAM WITH TOMOSYNTHESIS AND CAD
TECHNIQUE: Bilateral screening digital craniocaudal and mediolateral oblique
mammograms were obtained. Bilateral screening digital breast
tomosynthesis was performed. The images were evaluated with
computer-aided detection.

[L MLO synth-2D]
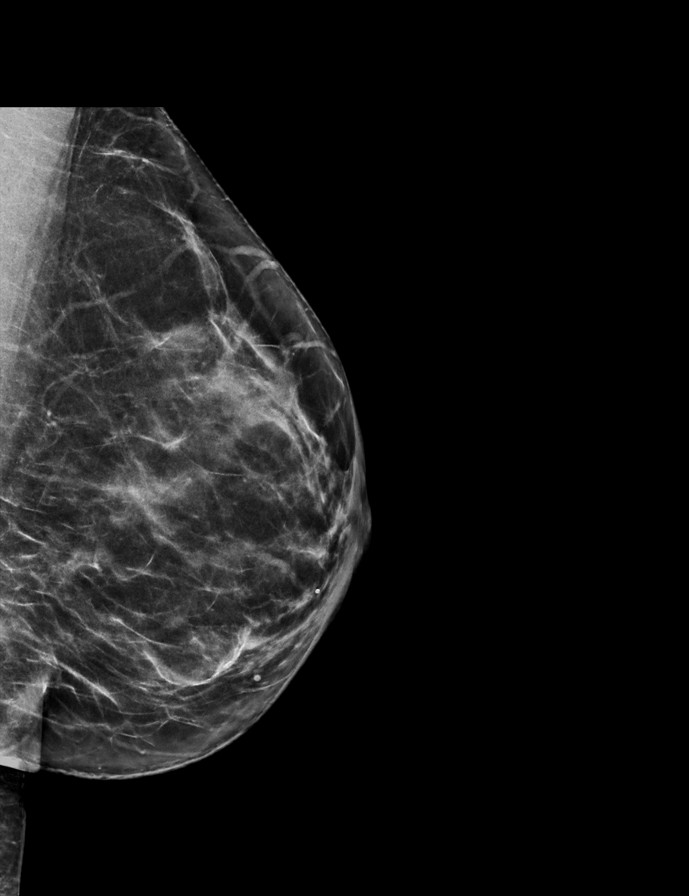

[R MLO synth-2D]
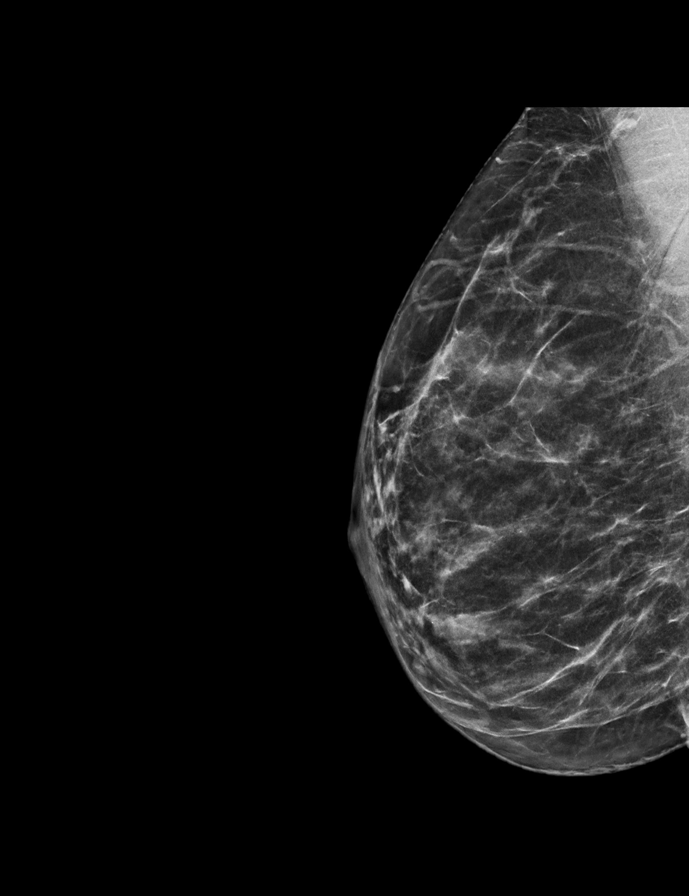

[L CC synth-2D]
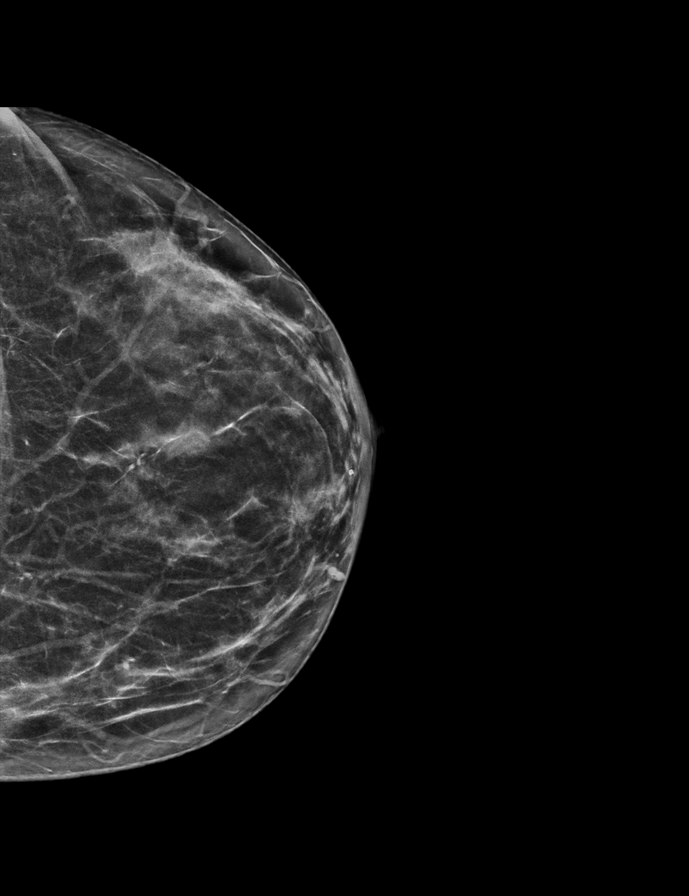

[R CC synth-2D]
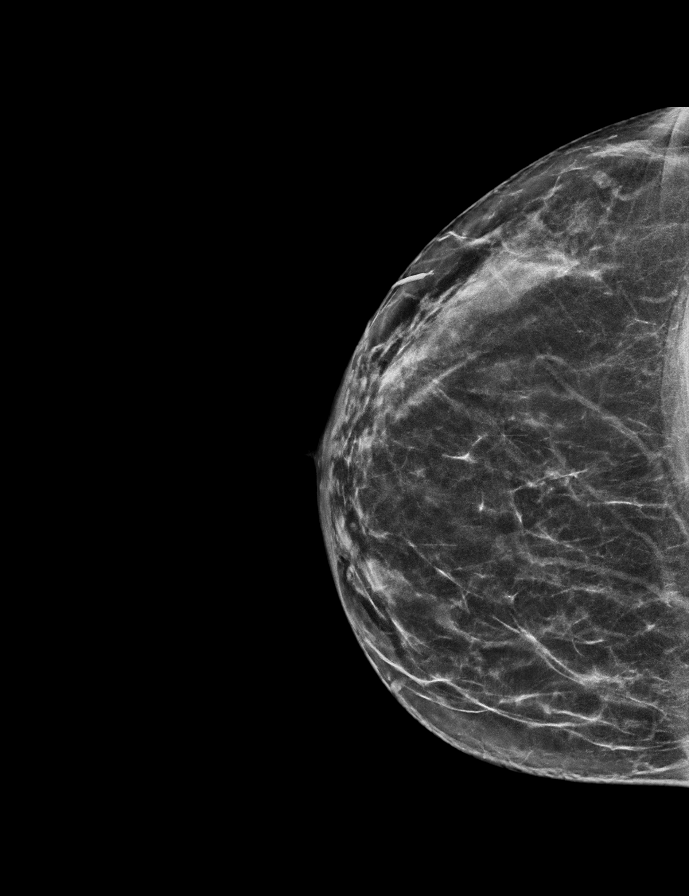

[L CC tomo · 2 of 59 frames shown]
[frame 20/59]
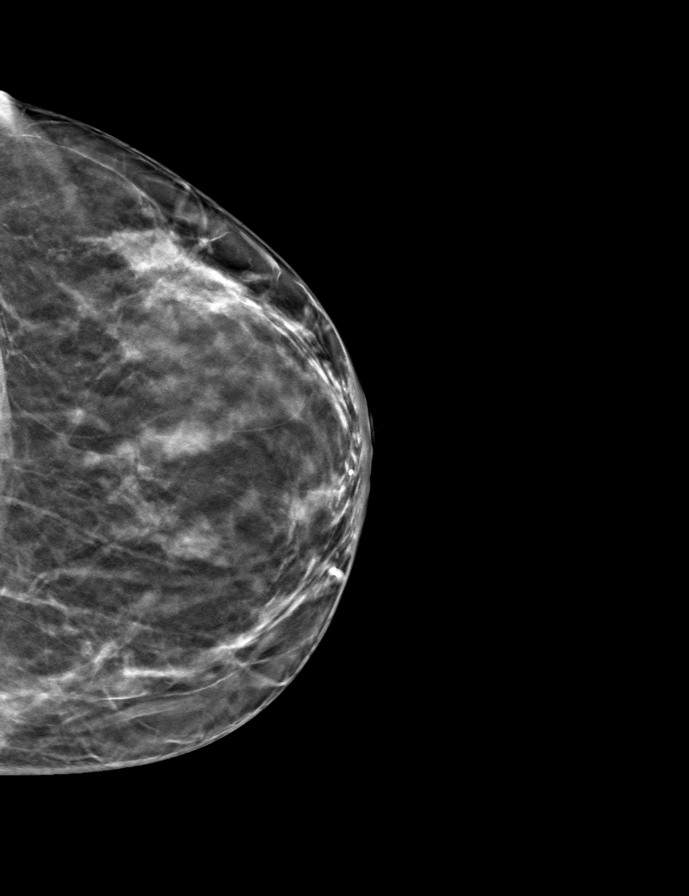
[frame 30/59]
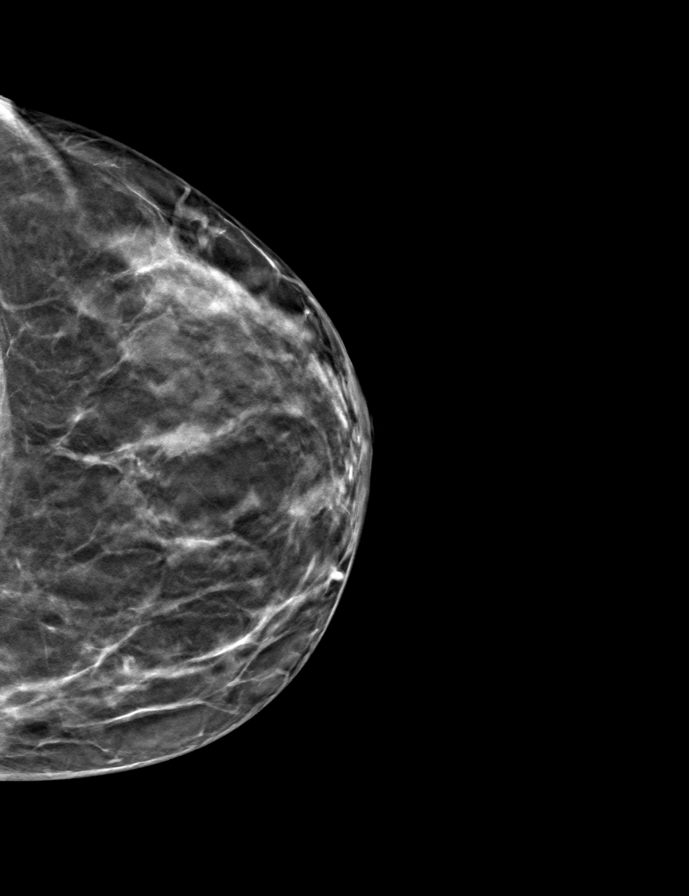

[R CC tomo · tomo slice 29/58.0]
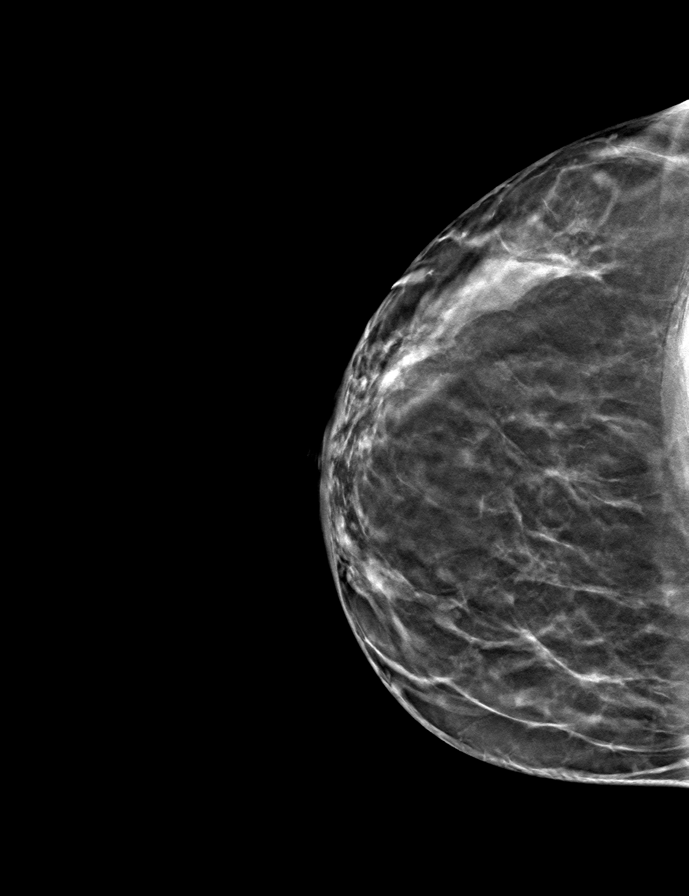

[R MLO tomo · tomo slice 31/62.0]
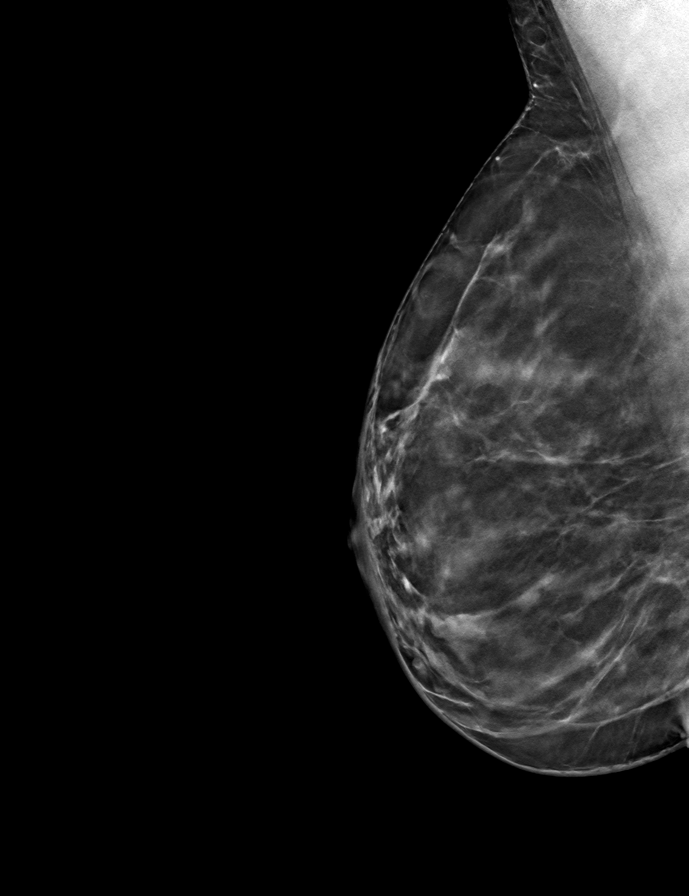

[L MLO tomo · tomo slice 32/63.0]
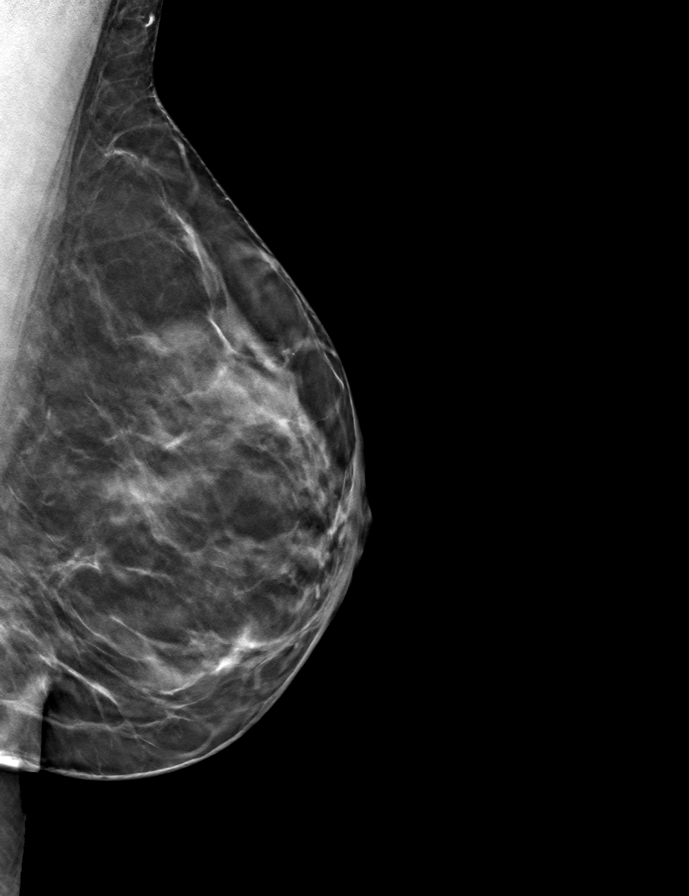

[9 of 24 positions shown; findings below may reference images not displayed]

ACR Breast Density Category b: There are scattered areas of
fibroglandular density.
FINDINGS: In the left breast, a possible asymmetry warrants further
evaluation. In the right breast, no findings suspicious for
malignancy.
IMPRESSION: Further evaluation is suggested for possible asymmetry in the left
breast.

RECOMMENDATION:
Diagnostic mammogram and possibly ultrasound of the left breast.
(Code:YX-V-99Z)

The patient will be contacted regarding the findings, and additional
imaging will be scheduled.

BI-RADS CATEGORY  0: Incomplete. Need additional imaging evaluation
and/or prior mammograms for comparison.

## 2022-04-02 IMAGING — US US BREAST*L* LIMITED INC AXILLA
1 series · 7 of 7 positions shown · non-contrast
Comparison: Previous exam(s).

CLINICAL DATA: Patient recalled from screening for left breast
mass.

EXAM:
DIGITAL DIAGNOSTIC UNILATERAL LEFT MAMMOGRAM WITH TOMOSYNTHESIS AND
CAD; ULTRASOUND LEFT BREAST LIMITED
TECHNIQUE: Left digital diagnostic mammography and breast tomosynthesis was
performed. The images were evaluated with computer-aided detection.;
Targeted ultrasound examination of the left breast was performed

[Series 1: us breast*left* limited inc axilla · 0.06mm/px · 7 of 7 slices shown]
[im 1/7]
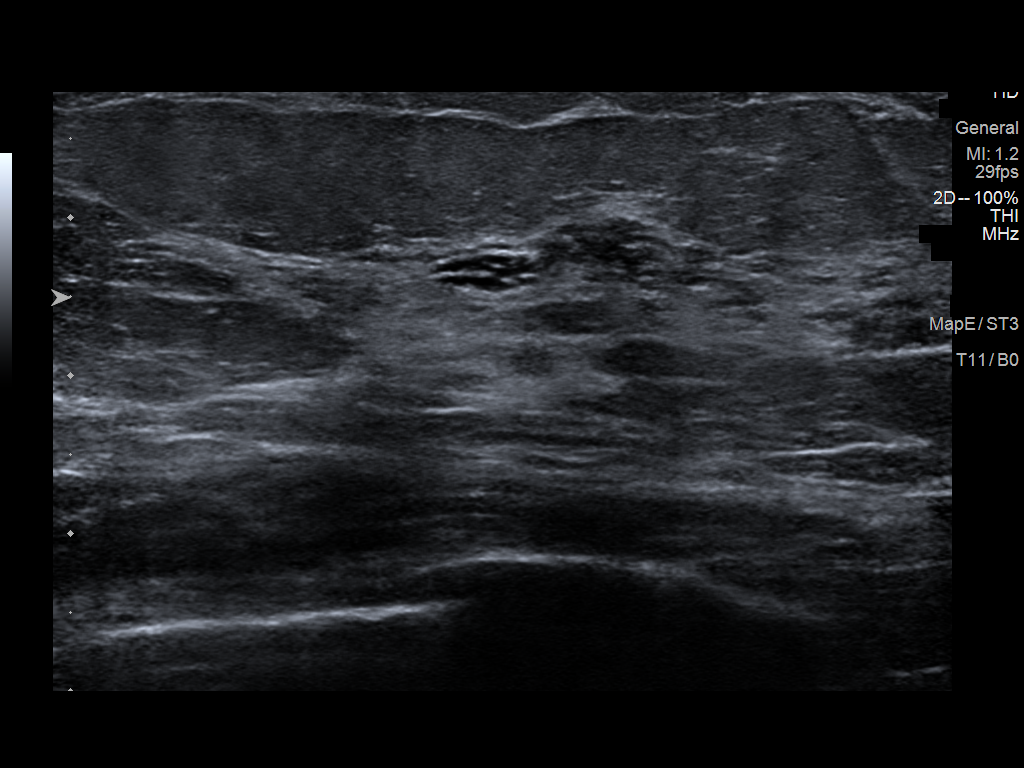
[im 2/7]
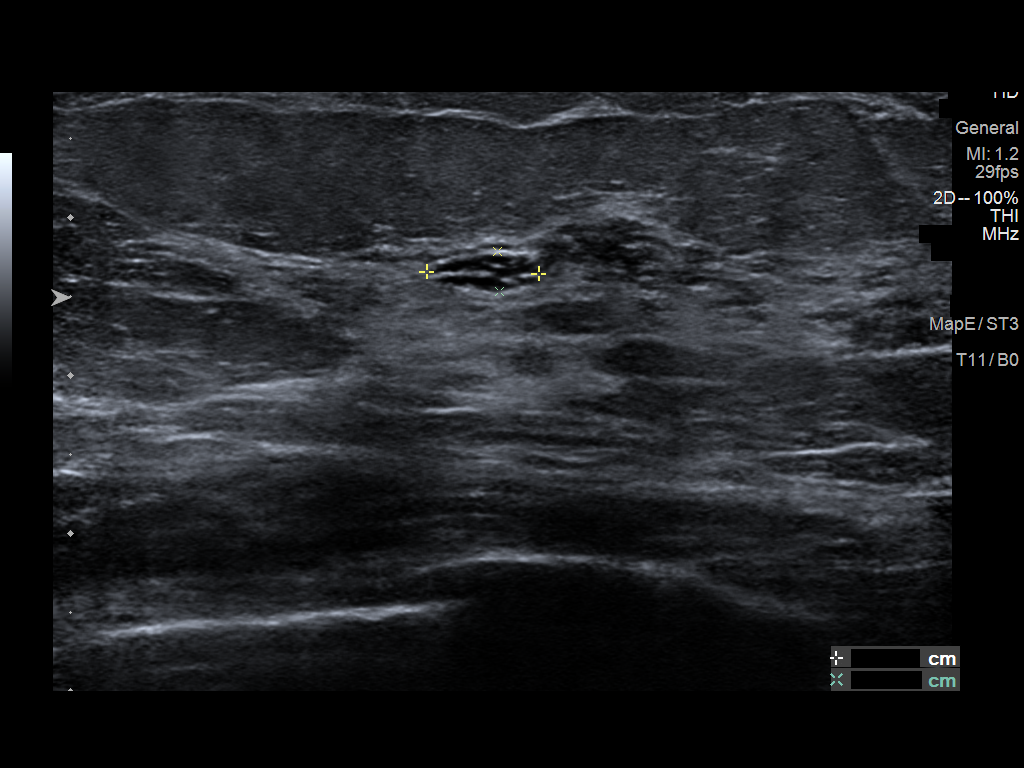
[im 3/7]
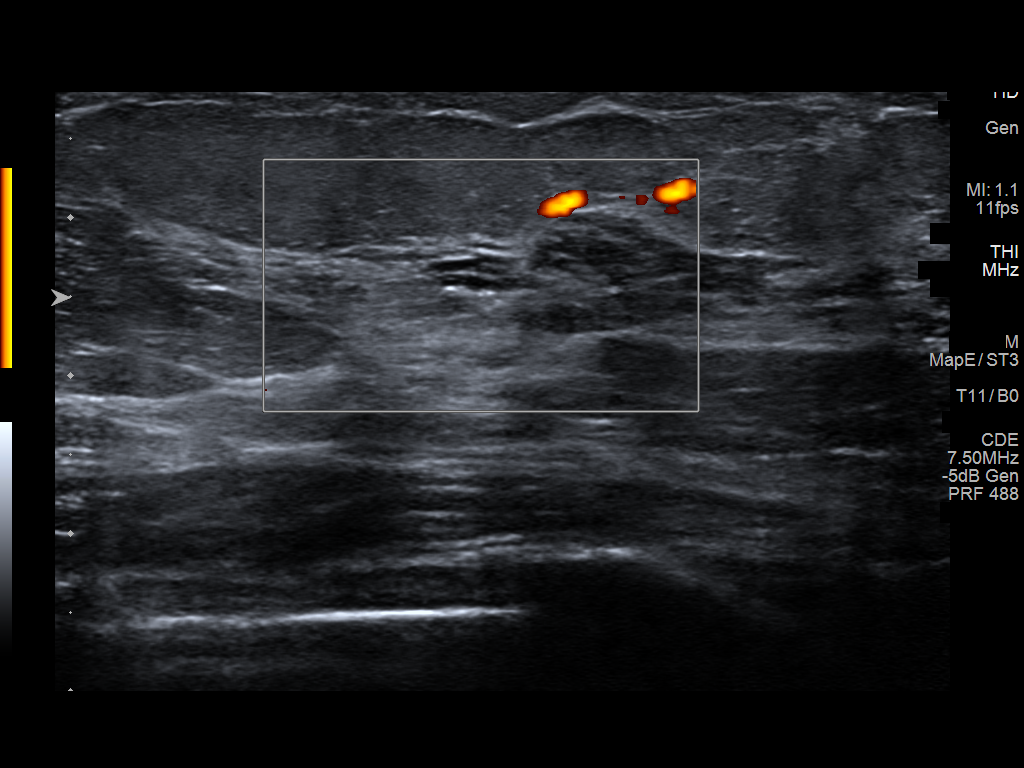
[im 4/7]
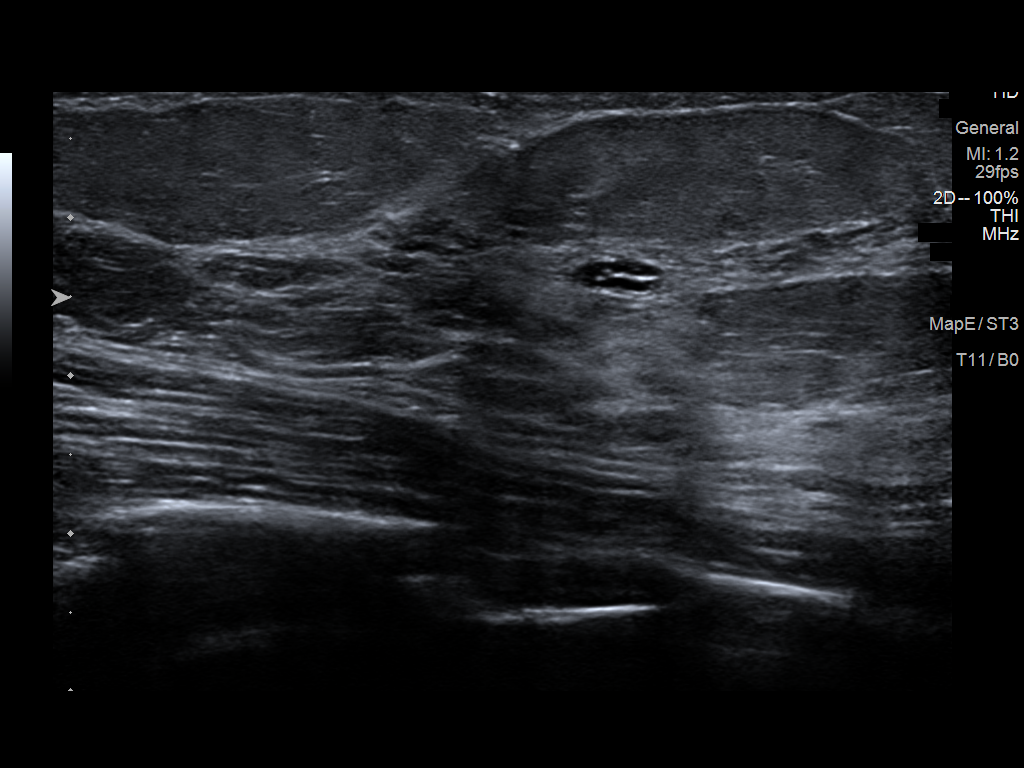
[im 5/7]
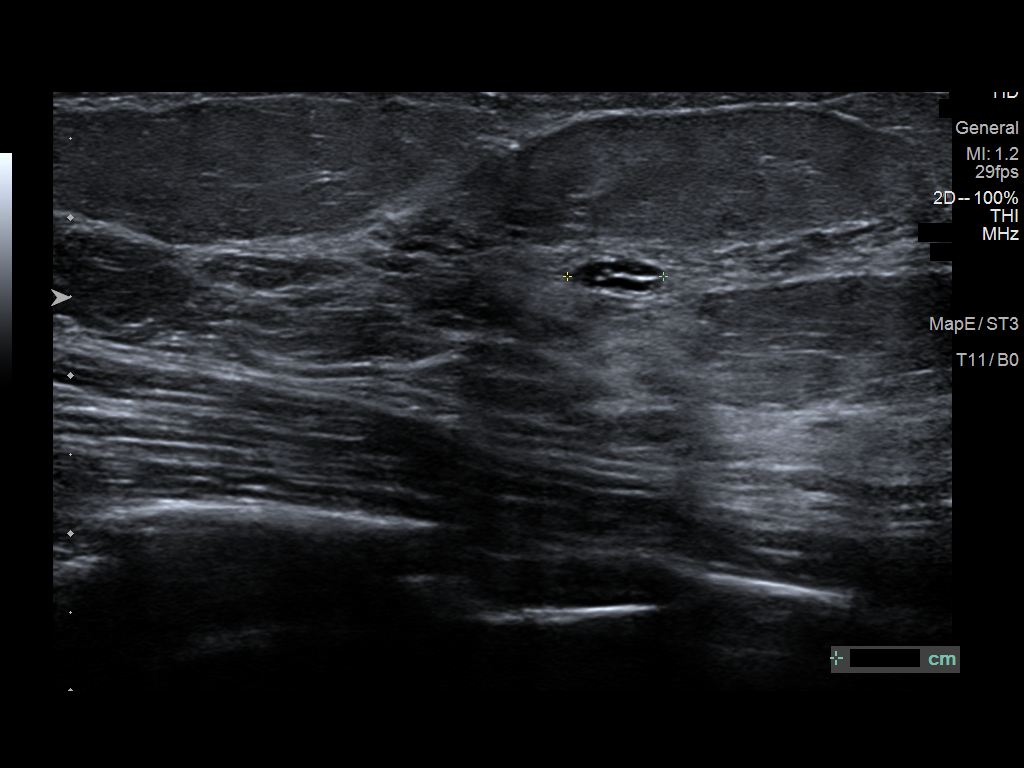
[im 6/7]
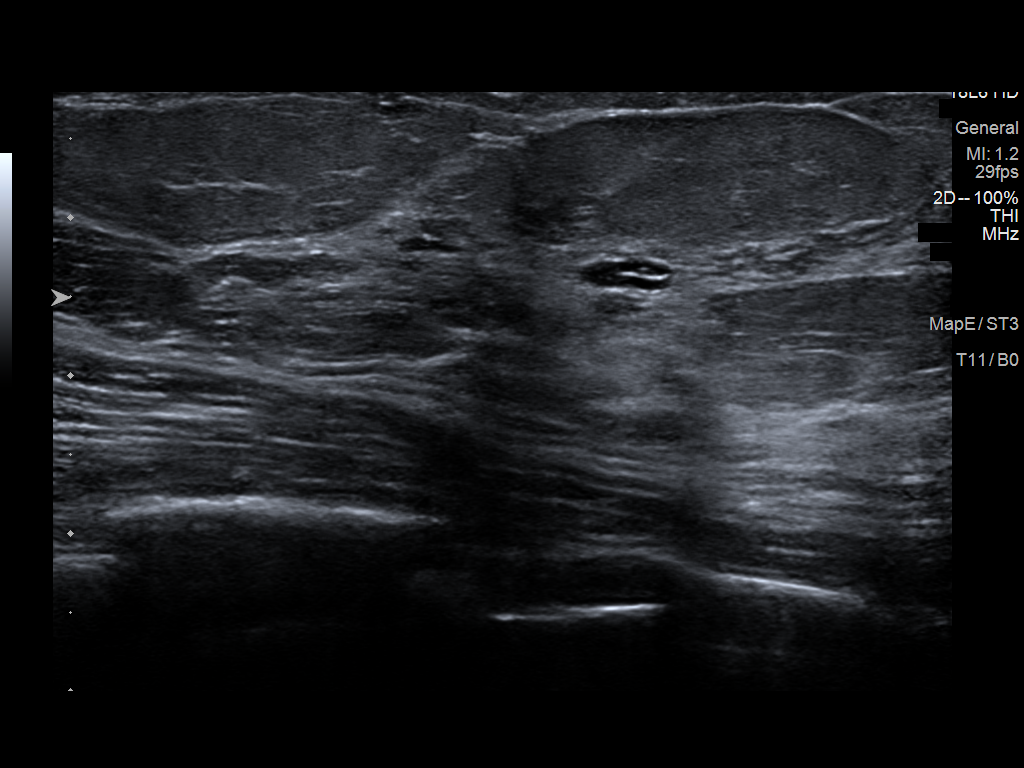
[im 7/7]
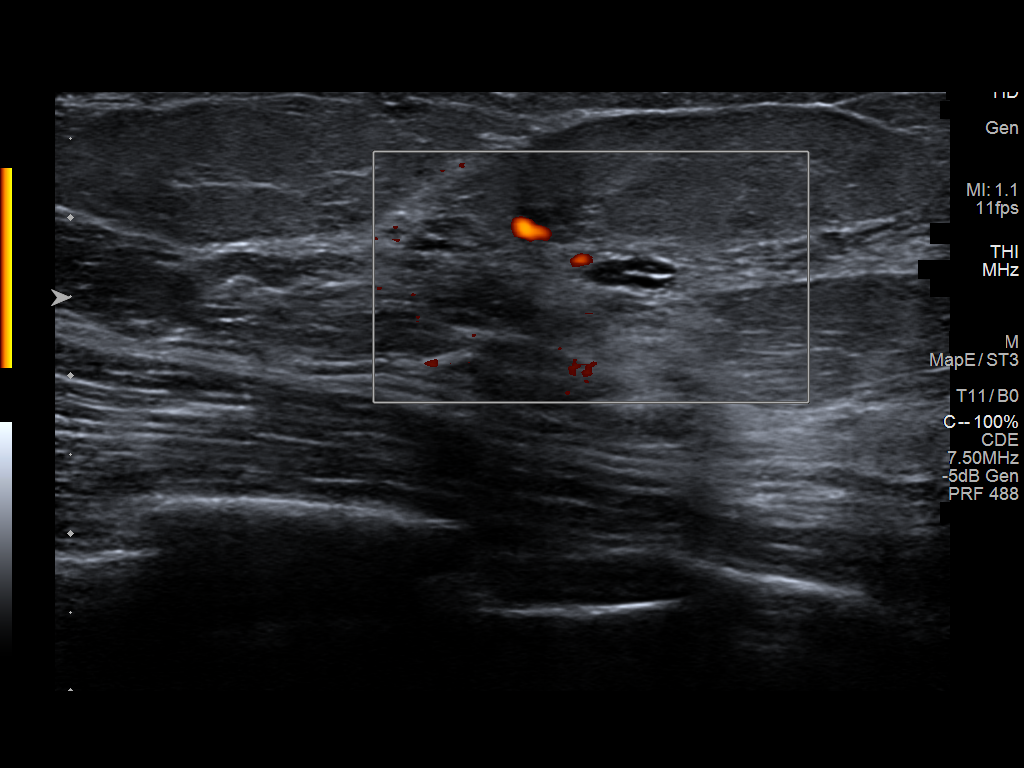

[7 of 7 positions shown; findings below may reference images not displayed]

ACR Breast Density Category c: The breast tissue is heterogeneously
dense, which may obscure small masses.
FINDINGS: Persistent low-density partially obscured mass within the superior
slightly lateral left breast middle depth, further evaluated with
additional imaging.

Targeted ultrasound is performed, showing dense tissue without
suspicious mass within the upper-outer left breast. Additionally
there is a 7 x 3 x 6 mm cluster of cysts left breast 12 o'clock
position 2 cm from nipple.
IMPRESSION: No mammographic evidence for malignancy. Left breast cluster of
cysts.

RECOMMENDATION:
Screening mammogram in one year.(Code:R9-T-1Z2)

I have discussed the findings and recommendations with the patient.
If applicable, a reminder letter will be sent to the patient
regarding the next appointment.

BI-RADS CATEGORY  2: Benign.

## 2022-05-24 ENCOUNTER — Other Ambulatory Visit: Payer: Self-pay | Admitting: Internal Medicine

## 2022-05-25 LAB — CBC
HCT: 40.3 % (ref 35.0–45.0)
Hemoglobin: 13.5 g/dL (ref 11.7–15.5)
MCH: 32.9 pg (ref 27.0–33.0)
MCHC: 33.5 g/dL (ref 32.0–36.0)
MCV: 98.3 fL (ref 80.0–100.0)
MPV: 10.7 fL (ref 7.5–12.5)
Platelets: 212 10*3/uL (ref 140–400)
RBC: 4.1 10*6/uL (ref 3.80–5.10)
RDW: 12.2 % (ref 11.0–15.0)
WBC: 4.6 10*3/uL (ref 3.8–10.8)

## 2022-05-25 LAB — COMPLETE METABOLIC PANEL WITH GFR
AG Ratio: 1.6 (calc) (ref 1.0–2.5)
ALT: 10 U/L (ref 6–29)
AST: 14 U/L (ref 10–30)
Albumin: 4.6 g/dL (ref 3.6–5.1)
Alkaline phosphatase (APISO): 74 U/L (ref 31–125)
BUN: 8 mg/dL (ref 7–25)
CO2: 22 mmol/L (ref 20–32)
Calcium: 9.6 mg/dL (ref 8.6–10.2)
Chloride: 104 mmol/L (ref 98–110)
Creat: 0.82 mg/dL (ref 0.50–0.97)
Globulin: 2.8 g/dL (calc) (ref 1.9–3.7)
Glucose, Bld: 82 mg/dL (ref 65–99)
Potassium: 4 mmol/L (ref 3.5–5.3)
Sodium: 137 mmol/L (ref 135–146)
Total Bilirubin: 0.8 mg/dL (ref 0.2–1.2)
Total Protein: 7.4 g/dL (ref 6.1–8.1)
eGFR: 93 mL/min/{1.73_m2} (ref >=60)

## 2022-05-25 LAB — LIPID PANEL
Cholesterol: 168 mg/dL (ref <200)
HDL: 59 mg/dL (ref >=50)
LDL Cholesterol (Calc): 95 mg/dL (calc)
Non-HDL Cholesterol (Calc): 109 mg/dL (calc) (ref <130)
Total CHOL/HDL Ratio: 2.8 (calc) (ref <5.0)
Triglycerides: 57 mg/dL (ref <150)

## 2022-05-25 LAB — VITAMIN D 25 HYDROXY (VIT D DEFICIENCY, FRACTURES): Vit D, 25-Hydroxy: 17 ng/mL — ABNORMAL LOW (ref 30–100)
